# Patient Record
Sex: Male | Born: 1935
Health system: Southern US, Community
[De-identification: ages and names within clinical notes are randomized; demographics above are authoritative.]

## PROBLEM LIST (undated history)

## (undated) DIAGNOSIS — M810 Age-related osteoporosis without current pathological fracture: Secondary | ICD-10-CM

## (undated) DIAGNOSIS — I10 Essential (primary) hypertension: Secondary | ICD-10-CM

## (undated) DIAGNOSIS — M4850XA Collapsed vertebra, not elsewhere classified, site unspecified, initial encounter for fracture: Secondary | ICD-10-CM

## (undated) DIAGNOSIS — K219 Gastro-esophageal reflux disease without esophagitis: Secondary | ICD-10-CM

## (undated) DIAGNOSIS — J302 Other seasonal allergic rhinitis: Secondary | ICD-10-CM

## (undated) DIAGNOSIS — J309 Allergic rhinitis, unspecified: Secondary | ICD-10-CM

## (undated) DIAGNOSIS — R972 Elevated prostate specific antigen [PSA]: Secondary | ICD-10-CM

## (undated) DIAGNOSIS — M1990 Unspecified osteoarthritis, unspecified site: Secondary | ICD-10-CM

## (undated) DIAGNOSIS — N400 Enlarged prostate without lower urinary tract symptoms: Secondary | ICD-10-CM

## (undated) DIAGNOSIS — D333 Benign neoplasm of cranial nerves: Secondary | ICD-10-CM

## (undated) DIAGNOSIS — M5410 Radiculopathy, site unspecified: Secondary | ICD-10-CM

## (undated) DIAGNOSIS — F329 Major depressive disorder, single episode, unspecified: Secondary | ICD-10-CM

## (undated) DIAGNOSIS — E559 Vitamin D deficiency, unspecified: Secondary | ICD-10-CM

## (undated) DIAGNOSIS — G4700 Insomnia, unspecified: Secondary | ICD-10-CM

## (undated) DIAGNOSIS — Z86010 Personal history of colonic polyps: Secondary | ICD-10-CM

## (undated) DIAGNOSIS — E538 Deficiency of other specified B group vitamins: Secondary | ICD-10-CM

## (undated) HISTORY — DX: Age-related osteoporosis without current pathological fracture: M81.0

## (undated) HISTORY — PX: CATARACT EXTRACTION W/ INTRAOCULAR LENS  IMPLANT, BILATERAL: SHX1307

## (undated) HISTORY — DX: Benign prostatic hyperplasia without lower urinary tract symptoms: N40.0

## (undated) HISTORY — DX: Major depressive disorder, single episode, unspecified: F32.9

## (undated) HISTORY — DX: Personal history of colonic polyps: Z86.010

## (undated) HISTORY — DX: Essential (primary) hypertension: I10

## (undated) HISTORY — PX: COLONOSCOPY: SHX174

## (undated) HISTORY — DX: Elevated prostate specific antigen (PSA): R97.20

## (undated) HISTORY — DX: Insomnia, unspecified: G47.00

## (undated) HISTORY — DX: Unspecified osteoarthritis, unspecified site: M19.90

## (undated) HISTORY — DX: Allergic rhinitis, unspecified: J30.9

## (undated) HISTORY — PX: OTHER SURGICAL HISTORY: SHX169

## (undated) HISTORY — DX: Vitamin D deficiency, unspecified: E55.9

## (undated) HISTORY — DX: Deficiency of other specified B group vitamins: E53.8

## (undated) HISTORY — DX: Other seasonal allergic rhinitis: J30.2

## (undated) HISTORY — DX: Gastro-esophageal reflux disease without esophagitis: K21.9

## (undated) HISTORY — DX: Radiculopathy, site unspecified: M54.10

## (undated) HISTORY — DX: Collapsed vertebra, not elsewhere classified, site unspecified, initial encounter for fracture: M48.50XA

---

## 1998-09-20 ENCOUNTER — Encounter: Payer: Self-pay | Admitting: Anesthesiology

## 1998-09-21 ENCOUNTER — Ambulatory Visit (HOSPITAL_COMMUNITY): Admission: RE | Admit: 1998-09-21 | Discharge: 1998-09-21 | Payer: Self-pay | Admitting: Specialist

## 1999-03-20 ENCOUNTER — Ambulatory Visit (HOSPITAL_COMMUNITY): Admission: RE | Admit: 1999-03-20 | Discharge: 1999-03-20 | Payer: Self-pay | Admitting: Specialist

## 2001-03-31 ENCOUNTER — Encounter (INDEPENDENT_AMBULATORY_CARE_PROVIDER_SITE_OTHER): Payer: Self-pay

## 2001-03-31 ENCOUNTER — Other Ambulatory Visit: Admission: RE | Admit: 2001-03-31 | Discharge: 2001-03-31 | Payer: Self-pay | Admitting: Internal Medicine

## 2001-03-31 LAB — HM COLONOSCOPY: HM Colonoscopy: ABNORMAL

## 2005-07-06 ENCOUNTER — Emergency Department (HOSPITAL_COMMUNITY): Admission: EM | Admit: 2005-07-06 | Discharge: 2005-07-06 | Payer: Self-pay | Admitting: Emergency Medicine

## 2005-07-11 ENCOUNTER — Encounter: Admission: RE | Admit: 2005-07-11 | Discharge: 2005-07-11 | Payer: Self-pay | Admitting: Endocrinology

## 2009-03-18 ENCOUNTER — Ambulatory Visit (HOSPITAL_COMMUNITY): Admission: RE | Admit: 2009-03-18 | Discharge: 2009-03-18 | Payer: Self-pay | Admitting: Ophthalmology

## 2009-04-01 ENCOUNTER — Ambulatory Visit (HOSPITAL_COMMUNITY): Admission: RE | Admit: 2009-04-01 | Discharge: 2009-04-01 | Payer: Self-pay | Admitting: Ophthalmology

## 2009-06-23 HISTORY — PX: OTHER SURGICAL HISTORY: SHX169

## 2010-01-05 ENCOUNTER — Ambulatory Visit: Payer: Self-pay | Admitting: Internal Medicine

## 2010-01-05 DIAGNOSIS — Z860100 Personal history of colon polyps, unspecified: Secondary | ICD-10-CM | POA: Insufficient documentation

## 2010-01-05 DIAGNOSIS — J309 Allergic rhinitis, unspecified: Secondary | ICD-10-CM

## 2010-01-05 DIAGNOSIS — N400 Enlarged prostate without lower urinary tract symptoms: Secondary | ICD-10-CM | POA: Insufficient documentation

## 2010-01-05 DIAGNOSIS — R972 Elevated prostate specific antigen [PSA]: Secondary | ICD-10-CM | POA: Insufficient documentation

## 2010-01-05 DIAGNOSIS — I10 Essential (primary) hypertension: Secondary | ICD-10-CM

## 2010-01-05 HISTORY — DX: Benign prostatic hyperplasia without lower urinary tract symptoms: N40.0

## 2010-01-05 HISTORY — DX: Personal history of colonic polyps: Z86.010

## 2010-01-05 HISTORY — DX: Elevated prostate specific antigen (PSA): R97.20

## 2010-01-05 HISTORY — DX: Allergic rhinitis, unspecified: J30.9

## 2010-01-05 HISTORY — DX: Personal history of colon polyps, unspecified: Z86.0100

## 2010-01-05 HISTORY — DX: Essential (primary) hypertension: I10

## 2010-01-05 LAB — CONVERTED CEMR LAB
Albumin: 4 g/dL (ref 3.5–5.2)
BUN: 14 mg/dL (ref 6–23)
Basophils Absolute: 0 10*3/uL (ref 0.0–0.1)
Bilirubin Urine: NEGATIVE
CO2: 27 meq/L (ref 19–32)
Chloride: 100 meq/L (ref 96–112)
Cholesterol: 168 mg/dL (ref 0–200)
Glucose, Bld: 89 mg/dL (ref 70–99)
HCT: 39.8 % (ref 39.0–52.0)
Ketones, ur: NEGATIVE mg/dL
LDL Cholesterol: 77 mg/dL (ref 0–99)
Leukocytes, UA: NEGATIVE
Lymphs Abs: 1.5 10*3/uL (ref 0.7–4.0)
MCHC: 33.3 g/dL (ref 30.0–36.0)
MCV: 98.9 fL (ref 78.0–100.0)
Monocytes Absolute: 0.4 10*3/uL (ref 0.1–1.0)
Neutro Abs: 3.9 10*3/uL (ref 1.4–7.7)
Nitrite: NEGATIVE
Platelets: 214 10*3/uL (ref 150.0–400.0)
Potassium: 4 meq/L (ref 3.5–5.1)
RDW: 13.2 % (ref 11.5–14.6)
Specific Gravity, Urine: 1.01 (ref 1.000–1.030)
Total Bilirubin: 0.9 mg/dL (ref 0.3–1.2)
Total Protein, Urine: NEGATIVE mg/dL
Triglycerides: 111 mg/dL (ref 0.0–149.0)
VLDL: 22.2 mg/dL (ref 0.0–40.0)
pH: 7 (ref 5.0–8.0)

## 2010-03-02 ENCOUNTER — Telehealth: Payer: Self-pay | Admitting: Internal Medicine

## 2010-06-26 ENCOUNTER — Encounter: Payer: Self-pay | Admitting: Internal Medicine

## 2010-10-18 ENCOUNTER — Ambulatory Visit: Payer: Self-pay | Admitting: Internal Medicine

## 2011-01-14 ENCOUNTER — Encounter: Payer: Self-pay | Admitting: Ophthalmology

## 2011-01-23 NOTE — Progress Notes (Signed)
Summary: RX request  Phone Note Call from Patient Call back at Home Phone [phone]   Caller: Patient Walk In Summary of Call: pt came into Clinic requesting refill of Bisoprolol, Amlodipine, Lisinopril and Tamsulosin to [facility] pharmacy. Initial call taken by: Margaret Pyle, CMA,  March 02, 2010 4:12 PM    Prescriptions: TAMSULOSIN HCL 0.4 MG CAPS (TAMSULOSIN HCL) 1 by mouth once daily  #90 x 3   Entered by:   Margaret Pyle, CMA   Authorized by:   Corwin Levins MD   Signed by:   Margaret Pyle, CMA on 03/02/2010   Method used:   Electronically to        [facility]* (mail-order)             ,          Ph: [phone]       Fax: [phone]   RxID:   [id number] LISINOPRIL 20 MG TABS (LISINOPRIL) 1 by mouth once daily  #90 x 3   Entered by:   Margaret Pyle, CMA   Authorized by:   Corwin Levins MD   Signed by:   Margaret Pyle, CMA on 03/02/2010   Method used:   Electronically to        [facility]* (mail-order)             ,          Ph: [phone]       Fax: [phone]   RxID:   [id number] AMLODIPINE BESYLATE 10 MG TABS (AMLODIPINE BESYLATE) 1 by mouth once daily  #90 x 3   Entered by:   Margaret Pyle, CMA   Authorized by:   Corwin Levins MD   Signed by:   Margaret Pyle, CMA on 03/02/2010   Method used:   Electronically to        [facility] [employer] [facility]* (mail-order)             ,          Ph: [phone]       Fax: [phone]   RxID:   [id number] BISOPROLOL-HYDROCHLOROTHIAZIDE 2.5-6.25 MG TABS (BISOPROLOL-HYDROCHLOROTHIAZIDE) 1 by mouth once daily  #90 x 3   Entered by:   Margaret Pyle, CMA   Authorized by:   Corwin Levins MD   Signed by:   Margaret Pyle, CMA on 03/02/2010   Method used:   Electronically to        [facility]* (mail-order)             ,          Ph: [phone]       Fax: [phone]   RxID:   [id number]

## 2011-01-23 NOTE — Letter (Signed)
Summary: Health Risk Assessment/[employer]  Health Risk Assessment/[employer]   Imported By: Sherian Rein 07/28/2010 08:24:46  _____________________________________________________________________  External Attachment:    Type:   Image     Comment:   External Document
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
## 2011-01-23 NOTE — Assessment & Plan Note (Signed)
Summary: FLU Zara Council /JWJ Natale Milch  Nurse Visit   Allergies: No Known Drug Allergies  Orders Added: 1)  Flu Vaccine 48yrs + MEDICARE PATIENTS [Q2039] 2)  Administration Flu vaccine - MCR [G0008]    Flu Vaccine Consent Questions     Do you have a history of severe allergic reactions to this vaccine? no    Any prior history of allergic reactions to egg and/or gelatin? no    Do you have a sensitivity to the preservative Thimersol? no    Do you have a past history of Guillan-Barre Syndrome? no    Do you currently have an acute febrile illness? no    Have you ever had a severe reaction to latex? no    Vaccine information given and explained to patient? yes    Are you currently pregnant? no    Lot Number:[id number]   Exp Date:06/23/2011   Site Given  Left Deltoid IM

## 2011-03-19 ENCOUNTER — Other Ambulatory Visit: Payer: Self-pay

## 2011-03-19 MED ORDER — MELOXICAM 15 MG PO TABS: 15.0000 mg | ORAL_TABLET | Freq: Every day | ORAL | Status: DC

## 2011-03-19 MED ORDER — OMEPRAZOLE 20 MG PO CPDR: 20.0000 mg | DELAYED_RELEASE_CAPSULE | Freq: Two times a day (BID) | ORAL | Status: DC

## 2011-03-19 MED ORDER — AMLODIPINE BESYLATE 10 MG PO TABS: 10.0000 mg | ORAL_TABLET | Freq: Every day | ORAL | Status: DC

## 2011-03-19 MED ORDER — TAMSULOSIN HCL 0.4 MG PO CAPS: 0.4000 mg | ORAL_CAPSULE | Freq: Every day | ORAL | Status: DC

## 2011-03-19 MED ORDER — BISOPROLOL-HYDROCHLOROTHIAZIDE 2.5-6.25 MG PO TABS: 1.0000 | ORAL_TABLET | Freq: Every day | ORAL | Status: DC

## 2011-03-19 MED ORDER — LISINOPRIL 20 MG PO TABS: 20.0000 mg | ORAL_TABLET | Freq: Every day | ORAL | Status: DC

## 2011-03-22 ENCOUNTER — Emergency Department (HOSPITAL_BASED_OUTPATIENT_CLINIC_OR_DEPARTMENT_OTHER)
Admission: EM | Admit: 2011-03-22 | Discharge: 2011-03-22 | Disposition: A | Discharge status: Home / Self Care | Payer: Medicare Other | Attending: Emergency Medicine | Admitting: Emergency Medicine

## 2011-03-22 ENCOUNTER — Emergency Department (INDEPENDENT_AMBULATORY_CARE_PROVIDER_SITE_OTHER): Payer: Medicare Other

## 2011-03-22 DIAGNOSIS — W11XXXA Fall on and from ladder, initial encounter: Secondary | ICD-10-CM

## 2011-03-22 DIAGNOSIS — Y92009 Unspecified place in unspecified non-institutional (private) residence as the place of occurrence of the external cause: Secondary | ICD-10-CM | POA: Insufficient documentation

## 2011-03-22 DIAGNOSIS — M79609 Pain in unspecified limb: Secondary | ICD-10-CM

## 2011-03-22 DIAGNOSIS — S8253XA Displaced fracture of medial malleolus of unspecified tibia, initial encounter for closed fracture: Secondary | ICD-10-CM

## 2011-03-22 DIAGNOSIS — S51809A Unspecified open wound of unspecified forearm, initial encounter: Secondary | ICD-10-CM | POA: Insufficient documentation

## 2011-03-22 DIAGNOSIS — Z79899 Other long term (current) drug therapy: Secondary | ICD-10-CM | POA: Insufficient documentation

## 2011-04-04 LAB — CREATININE, SERUM
Creatinine, Ser: 0.94 mg/dL (ref 0.4–1.5)
GFR calc non Af Amer: >60 mL/min

## 2011-04-04 LAB — BUN: BUN: 15 mg/dL (ref 6–23)

## 2011-04-24 ENCOUNTER — Ambulatory Visit: Payer: Self-pay | Admitting: Internal Medicine

## 2011-05-28 ENCOUNTER — Other Ambulatory Visit: Payer: Self-pay | Admitting: Internal Medicine

## 2011-06-28 ENCOUNTER — Encounter: Payer: Self-pay | Admitting: Internal Medicine

## 2011-07-01 ENCOUNTER — Encounter: Payer: Self-pay | Admitting: Internal Medicine

## 2011-07-01 DIAGNOSIS — Z0000 Encounter for general adult medical examination without abnormal findings: Secondary | ICD-10-CM | POA: Insufficient documentation

## 2011-07-03 ENCOUNTER — Encounter: Payer: Self-pay | Admitting: Internal Medicine

## 2011-07-03 ENCOUNTER — Other Ambulatory Visit (INDEPENDENT_AMBULATORY_CARE_PROVIDER_SITE_OTHER): Payer: Medicare Other

## 2011-07-03 ENCOUNTER — Ambulatory Visit (INDEPENDENT_AMBULATORY_CARE_PROVIDER_SITE_OTHER): Payer: Medicare Other | Admitting: Internal Medicine

## 2011-07-03 VITALS — BP 122/58 | HR 56 | Temp 97.9°F | Ht 70.0 in | Wt 196.0 lb

## 2011-07-03 DIAGNOSIS — Z0000 Encounter for general adult medical examination without abnormal findings: Secondary | ICD-10-CM

## 2011-07-03 DIAGNOSIS — Z125 Encounter for screening for malignant neoplasm of prostate: Secondary | ICD-10-CM

## 2011-07-03 DIAGNOSIS — G4700 Insomnia, unspecified: Secondary | ICD-10-CM

## 2011-07-03 DIAGNOSIS — I10 Essential (primary) hypertension: Secondary | ICD-10-CM

## 2011-07-03 HISTORY — DX: Insomnia, unspecified: G47.00

## 2011-07-03 LAB — CBC WITH DIFFERENTIAL/PLATELET
Eosinophils Absolute: 0 10*3/uL (ref 0.0–0.7)
Eosinophils Relative: 0.7 % (ref 0.0–5.0)
HCT: 39.5 % (ref 39.0–52.0)
Lymphocytes Relative: 25.5 % (ref 12.0–46.0)
Monocytes Absolute: 0.4 10*3/uL (ref 0.1–1.0)
Monocytes Relative: 6.8 % (ref 3.0–12.0)
Neutrophils Relative %: 66.5 % (ref 43.0–77.0)
Platelets: 250 10*3/uL (ref 150.0–400.0)
RDW: 13.9 % (ref 11.5–14.6)
WBC: 6 10*3/uL (ref 4.5–10.5)

## 2011-07-03 LAB — BASIC METABOLIC PANEL
BUN: 16 mg/dL (ref 6–23)
CO2: 29 mEq/L (ref 19–32)
GFR: 108.88 mL/min (ref >60.00)
Glucose, Bld: 89 mg/dL (ref 70–99)
Potassium: 5.1 mEq/L (ref 3.5–5.1)
Sodium: 141 mEq/L (ref 135–145)

## 2011-07-03 LAB — HEPATIC FUNCTION PANEL
ALT: 14 U/L (ref 0–53)
AST: 16 U/L (ref 0–37)
Albumin: 4.3 g/dL (ref 3.5–5.2)
Alkaline Phosphatase: 67 U/L (ref 39–117)
Total Protein: 6.7 g/dL (ref 6.0–8.3)

## 2011-07-03 LAB — URINALYSIS, ROUTINE W REFLEX MICROSCOPIC
Ketones, ur: NEGATIVE
Leukocytes, UA: NEGATIVE
Nitrite: NEGATIVE
Specific Gravity, Urine: 1.015 (ref 1.000–1.030)
pH: 7.5 (ref 5.0–8.0)

## 2011-07-03 LAB — LIPID PANEL
Cholesterol: 153 mg/dL (ref 0–200)
HDL: 84.1 mg/dL (ref >39.00)
VLDL: 3.8 mg/dL (ref 0.0–40.0)

## 2011-07-03 LAB — PSA: PSA: 3.61 ng/mL (ref 0.10–4.00)

## 2011-07-03 MED ORDER — MELOXICAM 15 MG PO TABS: 15.0000 mg | ORAL_TABLET | Freq: Every day | ORAL | Status: AC

## 2011-07-03 MED ORDER — AMLODIPINE BESYLATE 10 MG PO TABS: 10.0000 mg | ORAL_TABLET | Freq: Every day | ORAL | Status: DC

## 2011-07-03 MED ORDER — ZOLPIDEM TARTRATE ER 6.25 MG PO TBCR: 6.2500 mg | EXTENDED_RELEASE_TABLET | Freq: Every evening | ORAL | Status: DC | PRN

## 2011-07-03 MED ORDER — BISOPROLOL-HYDROCHLOROTHIAZIDE 2.5-6.25 MG PO TABS: 1.0000 | ORAL_TABLET | Freq: Every day | ORAL | Status: DC

## 2011-07-03 MED ORDER — LISINOPRIL 20 MG PO TABS: 20.0000 mg | ORAL_TABLET | Freq: Every day | ORAL | Status: DC

## 2011-07-03 MED ORDER — TAMSULOSIN HCL 0.4 MG PO CAPS: 0.4000 mg | ORAL_CAPSULE | Freq: Every day | ORAL | Status: DC

## 2011-07-03 MED ORDER — OMEPRAZOLE 20 MG PO CPDR: 20.0000 mg | DELAYED_RELEASE_CAPSULE | Freq: Two times a day (BID) | ORAL | Status: DC

## 2011-07-03 NOTE — Assessment & Plan Note (Signed)
Overall doing well, age appropriate education and counseling updated, referrals for preventative services and immunizations addressed, dietary and smoking counseling addressed, most recent labs and ECG reviewed.  I have personally reviewed and have noted: 1) the patient's medical and social history 2) The pt's use of alcohol, tobacco, and illicit drugs 3) The patient's current medications and supplements 4) Functional ability including ADL's, fall risk, home safety risk, hearing and visual impairment 5) Diet and physical activities 6) Evidence for depression or mood disorder 7) The patient's height, weight, and BMI have been recorded in the chart I have made referrals, and provided counseling and education based on review of the above Also for colonoscopy as he is due
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
## 2011-07-03 NOTE — Assessment & Plan Note (Signed)
Ok to try [employer] cr prn . . to f/u any worsening symptoms or concerns

## 2011-07-03 NOTE — Patient Instructions (Addendum)
Take all new medications as prescribed - the sleep medication (which can be increased if not working well) You will be contacted regarding the referral for: colonoscopy Please go to LAB in the Basement for the blood and/or urine tests to be done today Please call the phone number [phone] (the PhoneTree System) for results of testing in 2-3 days;  When calling, simply dial the number, and when prompted enter the MRN number above (the Medical Record Number) and the # key, then the message should start. The current medical regimen is effective;  continue present plan and medications. - all meds were sent to [facility] Please return in 1 year for your yearly visit, or sooner if needed, with Lab testing done 3-5 days before

## 2011-07-03 NOTE — Progress Notes (Signed)
Quick Note:  Voice message left on PhoneTree system - lab is negative, normal or otherwise stable, pt to continue same tx ______ 

## 2011-07-11 ENCOUNTER — Telehealth: Payer: Self-pay | Admitting: *Deleted

## 2011-07-11 MED ORDER — ZOLPIDEM TARTRATE ER 12.5 MG PO TBCR: 12.5000 mg | EXTENDED_RELEASE_TABLET | Freq: Every evening | ORAL | Status: DC | PRN

## 2011-07-11 NOTE — Telephone Encounter (Signed)
Patient c/o sleep medication not working. Requesting higher dosage or alternative medication.

## 2011-07-11 NOTE — Telephone Encounter (Signed)
Ok to take 2 of his ambien Cr 6.25, and new rx Done hardcopy to sharon for the ambien cr 12.5

## 2011-07-12 ENCOUNTER — Encounter: Payer: Self-pay | Admitting: Internal Medicine

## 2011-07-12 NOTE — Telephone Encounter (Signed)
Pt informed Rx ready for p/u.

## 2011-08-22 ENCOUNTER — Ambulatory Visit (AMBULATORY_SURGERY_CENTER): Payer: Medicare Other | Admitting: *Deleted

## 2011-08-22 ENCOUNTER — Encounter: Payer: Self-pay | Admitting: Internal Medicine

## 2011-08-22 VITALS — Ht 70.0 in | Wt 194.0 lb

## 2011-08-22 DIAGNOSIS — Z1211 Encounter for screening for malignant neoplasm of colon: Secondary | ICD-10-CM

## 2011-08-22 MED ORDER — SUPREP BOWEL PREP KIT 17.5-3.13-1.6 GM/177ML PO SOLN: 1.0000 | ORAL | Status: DC

## 2011-09-05 ENCOUNTER — Encounter: Payer: Self-pay | Admitting: Internal Medicine

## 2011-09-05 ENCOUNTER — Ambulatory Visit (AMBULATORY_SURGERY_CENTER): Payer: Medicare Other | Admitting: Internal Medicine

## 2011-09-05 VITALS — BP 145/60 | HR 51 | Temp 98.3°F | Resp 16 | Ht 70.0 in | Wt 194.0 lb

## 2011-09-05 DIAGNOSIS — K5730 Diverticulosis of large intestine without perforation or abscess without bleeding: Secondary | ICD-10-CM

## 2011-09-05 DIAGNOSIS — D126 Benign neoplasm of colon, unspecified: Secondary | ICD-10-CM

## 2011-09-05 DIAGNOSIS — D129 Benign neoplasm of anus and anal canal: Secondary | ICD-10-CM

## 2011-09-05 DIAGNOSIS — Z1211 Encounter for screening for malignant neoplasm of colon: Secondary | ICD-10-CM

## 2011-09-05 DIAGNOSIS — D128 Benign neoplasm of rectum: Secondary | ICD-10-CM

## 2011-09-05 MED ORDER — SODIUM CHLORIDE 0.9 % IV SOLN: 500.0000 mL | INTRAVENOUS | Status: DC

## 2011-09-05 NOTE — Patient Instructions (Signed)
Please refer to your blue and neon green sheets for instructions regarding diet and activity for the rest of today.  You may resume your medications as you would normally take them.  You will receive a letter in the mail in about two weeks regarding the results of the biopsies taken today.  Polyps, Colon  A polyp is extra tissue that grows inside your body. Colon polyps grow in the large intestine. The large intestine, also called the colon, is part of your digestive system. It is a long, hollow tube at the end of your digestive tract where your body makes and stores stool. Most polyps are not dangerous. They are benign. This means they are not cancerous. But over time, some types of polyps can turn into cancer. Polyps that are smaller than a pea are usually not harmful. But larger polyps could someday become or may already be cancerous. To be safe, doctors remove all polyps and test them.  WHO GETS POLYPS? Anyone can get polyps, but certain people are more likely than others. You may have a greater chance of getting polyps if:  You are over 50.   You have had polyps before.   Someone in your family has had polyps.   Someone in your family has had cancer of the large intestine.   Find out if someone in your family has had polyps. You may also be more likely to get polyps if you:   Eat a lot of fatty foods   Smoke   Drink alcohol   Do not exercise  Eat too much  SYMPTOMS Most small polyps do not cause symptoms. People often do not know they have one until their caregiver finds it during a regular checkup or while testing them for something else. Some people do have symptoms like these:  Bleeding from the anus. You might notice blood on your underwear or on toilet paper after you have had a bowel movement.   Constipation or diarrhea that lasts more than a week.   Blood in the stool. Blood can make stool look black or it can show up as red streaks in the stool.  If you have any of  these symptoms, see your caregiver. HOW DOES THE DOCTOR TEST FOR POLYPS? The doctor can use four tests to check for polyps:  Digital rectal exam. The caregiver wears gloves and checks your rectum (the last part of the large intestine) to see if it feels normal. This test would find polyps only in the rectum. Your caregiver may need to do one of the other tests listed below to find polyps higher up in the intestine.   Barium enema. The caregiver puts a liquid called barium into your rectum before taking x-rays of your large intestine. Barium makes your intestine look white in the pictures. Polyps are dark, so they are easy to see.   Sigmoidoscopy. With this test, the caregiver can see inside your large intestine. A thin flexible tube is placed into your rectum. The device is called a sigmoidoscope, which has a light and a tiny video camera in it. The caregiver uses the sigmoidoscope to look at the last third of your large intestine.   Colonoscopy. This test is like sigmoidoscopy, but the caregiver looks at all of the large intestine. It usually requires sedation. This is the most common method for finding and removing polyps.  TREATMENT  The caregiver will remove the polyp during sigmoidoscopy or colonoscopy. The polyp is then tested for cancer.  If you have had polyps, your caregiver may want you to get tested regularly in the future.  PREVENTION There is not one sure way to prevent polyps. You might be able to lower your risk of getting them if you:  Eat more fruits and vegetables and less fatty food.   Do not smoke.   Avoid alcohol.   Exercise every day.   Lose weight if you are overweight.   Eating more calcium and folate can also lower your risk of getting polyps. Some foods that are rich in calcium are milk, cheese, and broccoli. Some foods that are rich in folate are chickpeas, kidney beans, and spinach.   Aspirin might help prevent polyps. Studies are under way.  Document  Released: 09/05/2004 Document Re-Released: 05/30/2010 [facility] Patient Information 2011 [place], [place].  Diverticulosis Diverticulosis is a common condition that develops when small pouches (diverticula) form in the wall of the colon. The risk of diverticulosis increases with age. It happens more often in people who eat a low-fiber diet. Most individuals with diverticulosis have no symptoms. Those individuals with symptoms usually experience belly (abdominal) pain, constipation, or loose stools (diarrhea). HOME CARE INSTRUCTIONS  Increase the amount of fiber in your diet as directed by your caregiver or dietician. This may reduce symptoms of diverticulosis.   Your caregiver may recommend taking a dietary fiber supplement.   Drink at least 6 to 8 glasses of water each day to prevent constipation.   Try not to strain when you have a bowel movement.   Your caregiver may recommend avoiding nuts and seeds to prevent complications, although this is still an uncertain benefit.   Only take over-the-counter or prescription medicines for pain, discomfort, or fever as directed by your caregiver.  FOODS HAVING HIGH FIBER CONTENT INCLUDE:  Fruits. Apple, peach, pear, tangerine, raisins, prunes.   Vegetables. Brussels sprouts, asparagus, broccoli, cabbage, carrot, cauliflower, romaine lettuce, spinach, summer squash, tomato, winter squash, zucchini.   Starchy Vegetables. Baked beans, kidney beans, lima beans, split peas, lentils, potatoes (with skin).   Grains. Whole wheat bread, brown rice, bran flake cereal, plain oatmeal, white rice, shredded wheat, bran muffins.  SEEK IMMEDIATE MEDICAL CARE IF:  You develop increasing pain or severe bloating.   You have an increased oral temperature, not controlled by medicine.   You develop vomiting or bowel movements that are bloody or black.  Document Released: 09/06/2004 Document Re-Released: 05/30/2010 [facility] Patient Information 2011 [place],  [place].

## 2011-09-06 ENCOUNTER — Telehealth: Payer: Self-pay | Admitting: *Deleted

## 2011-09-06 NOTE — Telephone Encounter (Signed)

## 2011-10-30 ENCOUNTER — Ambulatory Visit (INDEPENDENT_AMBULATORY_CARE_PROVIDER_SITE_OTHER): Payer: Medicare Other

## 2011-10-30 DIAGNOSIS — Z23 Encounter for immunization: Secondary | ICD-10-CM

## 2012-02-19 ENCOUNTER — Ambulatory Visit (INDEPENDENT_AMBULATORY_CARE_PROVIDER_SITE_OTHER): Payer: Medicare Other | Admitting: Internal Medicine

## 2012-02-19 ENCOUNTER — Encounter: Payer: Self-pay | Admitting: Internal Medicine

## 2012-02-19 VITALS — BP 142/60 | HR 67 | Temp 97.3°F | Ht 69.0 in | Wt 191.5 lb

## 2012-02-19 DIAGNOSIS — R609 Edema, unspecified: Secondary | ICD-10-CM

## 2012-02-19 DIAGNOSIS — R21 Rash and other nonspecific skin eruption: Secondary | ICD-10-CM

## 2012-02-19 DIAGNOSIS — M79671 Pain in right foot: Secondary | ICD-10-CM | POA: Insufficient documentation

## 2012-02-19 DIAGNOSIS — I10 Essential (primary) hypertension: Secondary | ICD-10-CM

## 2012-02-19 DIAGNOSIS — M79672 Pain in left foot: Secondary | ICD-10-CM

## 2012-02-19 DIAGNOSIS — M79609 Pain in unspecified limb: Secondary | ICD-10-CM

## 2012-02-19 MED ORDER — ZOLPIDEM TARTRATE ER 12.5 MG PO TBCR: 12.5000 mg | EXTENDED_RELEASE_TABLET | Freq: Every evening | ORAL | Status: DC | PRN

## 2012-02-19 MED ORDER — AMITRIPTYLINE HCL 100 MG PO TABS: 100.0000 mg | ORAL_TABLET | Freq: Every day | ORAL | Status: DC

## 2012-02-19 NOTE — Patient Instructions (Addendum)
Take all new medications as prescribed - the amitryptilene for pain The amitryptilene also helps with sleep, so you may be able to stop the ambien if the sleep is ok with the amitryptilene Continue all other medications as before

## 2012-02-24 ENCOUNTER — Encounter: Payer: Self-pay | Admitting: Internal Medicine

## 2012-02-24 DIAGNOSIS — R609 Edema, unspecified: Secondary | ICD-10-CM | POA: Insufficient documentation

## 2012-02-24 DIAGNOSIS — R21 Rash and other nonspecific skin eruption: Secondary | ICD-10-CM | POA: Insufficient documentation
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
## 2012-02-24 NOTE — Assessment & Plan Note (Signed)
stable overall by hx and exam, most recent data reviewed with pt, and pt to continue medical treatment as before  BP Readings from Last 3 Encounters:  02/19/12 142/60  09/05/11 145/60  07/03/11 122/58

## 2012-02-24 NOTE — Assessment & Plan Note (Signed)
?   Edema vs inflmaatory related; for triam cr prn,  to f/u any worsening symptoms or concerns

## 2012-02-24 NOTE — Assessment & Plan Note (Signed)
stable overall by hx and exam, most recent data reviewed with pt, and pt to continue medical treatment as before  Lab Results  Component Value Date   WBC 6.0 07/03/2011   HGB 13.4 07/03/2011   HCT 39.5 07/03/2011   PLT 250.0 07/03/2011   GLUCOSE 89 07/03/2011   CHOL 153 07/03/2011   TRIG 19.0 07/03/2011   HDL 84.10 07/03/2011   LDLCALC 65 07/03/2011   ALT 14 07/03/2011   AST 16 07/03/2011   NA 141 07/03/2011   K 5.1 07/03/2011   CL 107 07/03/2011   CREATININE 0.7 07/03/2011   BUN 16 07/03/2011   CO2 29 07/03/2011   TSH 1.34 07/03/2011   PSA 3.61 07/03/2011

## 2012-02-24 NOTE — Assessment & Plan Note (Addendum)
Suspect neuropathic pain - for elavil qhs prn,  to f/u any worsening symptoms or concerns, declines ncs at this time

## 2012-06-06 ENCOUNTER — Other Ambulatory Visit: Payer: Self-pay

## 2012-06-06 MED ORDER — ZOLPIDEM TARTRATE ER 12.5 MG PO TBCR: 12.5000 mg | EXTENDED_RELEASE_TABLET | Freq: Every evening | ORAL | Status: DC | PRN

## 2012-06-06 NOTE — Telephone Encounter (Signed)
Faxed hardcopy to pharmacy. 

## 2012-06-06 NOTE — Telephone Encounter (Signed)
Done hardcopy to robin  

## 2012-06-30 ENCOUNTER — Other Ambulatory Visit (INDEPENDENT_AMBULATORY_CARE_PROVIDER_SITE_OTHER): Payer: Medicare Other

## 2012-06-30 DIAGNOSIS — Z79899 Other long term (current) drug therapy: Secondary | ICD-10-CM

## 2012-06-30 DIAGNOSIS — Z125 Encounter for screening for malignant neoplasm of prostate: Secondary | ICD-10-CM

## 2012-06-30 DIAGNOSIS — Z0000 Encounter for general adult medical examination without abnormal findings: Secondary | ICD-10-CM

## 2012-06-30 LAB — BASIC METABOLIC PANEL
BUN: 13 mg/dL (ref 6–23)
Calcium: 8.8 mg/dL (ref 8.4–10.5)
Creatinine, Ser: 0.8 mg/dL (ref 0.4–1.5)
GFR: 93.82 mL/min (ref >60.00)
Glucose, Bld: 100 mg/dL — ABNORMAL HIGH (ref 70–99)

## 2012-06-30 LAB — HEPATIC FUNCTION PANEL
ALT: 14 U/L (ref 0–53)
AST: 18 U/L (ref 0–37)
Bilirubin, Direct: 0.1 mg/dL (ref 0.0–0.3)
Total Bilirubin: 0.9 mg/dL (ref 0.3–1.2)
Total Protein: 6.7 g/dL (ref 6.0–8.3)

## 2012-06-30 LAB — CBC WITH DIFFERENTIAL/PLATELET
Basophils Relative: 0.5 % (ref 0.0–3.0)
Eosinophils Relative: 1 % (ref 0.0–5.0)
HCT: 39.6 % (ref 39.0–52.0)
MCV: 97.7 fl (ref 78.0–100.0)
Monocytes Absolute: 0.4 10*3/uL (ref 0.1–1.0)
Monocytes Relative: 7.1 % (ref 3.0–12.0)
Neutrophils Relative %: 58.3 % (ref 43.0–77.0)
Platelets: 226 10*3/uL (ref 150.0–400.0)
RBC: 4.05 Mil/uL — ABNORMAL LOW (ref 4.22–5.81)
WBC: 5.2 10*3/uL (ref 4.5–10.5)

## 2012-06-30 LAB — URINALYSIS, ROUTINE W REFLEX MICROSCOPIC
Hgb urine dipstick: NEGATIVE
Ketones, ur: NEGATIVE
Total Protein, Urine: NEGATIVE
Urine Glucose: NEGATIVE
Urobilinogen, UA: 0.2 (ref 0.0–1.0)

## 2012-06-30 LAB — LIPID PANEL: Cholesterol: 163 mg/dL (ref 0–200)

## 2012-06-30 LAB — TSH: TSH: 1.99 u[IU]/mL (ref 0.35–5.50)

## 2012-07-03 ENCOUNTER — Ambulatory Visit (INDEPENDENT_AMBULATORY_CARE_PROVIDER_SITE_OTHER): Payer: Medicare Other | Admitting: Internal Medicine

## 2012-07-03 ENCOUNTER — Encounter: Payer: Self-pay | Admitting: Internal Medicine

## 2012-07-03 VITALS — BP 110/60 | HR 54 | Temp 97.1°F | Ht 70.0 in | Wt 191.1 lb

## 2012-07-03 DIAGNOSIS — Z0000 Encounter for general adult medical examination without abnormal findings: Secondary | ICD-10-CM

## 2012-07-03 NOTE — Patient Instructions (Addendum)
Continue all other medications as before You are otherwise up to date with prevention Please have the pharmacy call with any refills you may need. Please return in 1 year for your yearly visit, or sooner if needed, with Lab testing done 3-5 days before

## 2012-07-03 NOTE — Assessment & Plan Note (Signed)
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
## 2012-09-10 ENCOUNTER — Telehealth: Payer: Self-pay | Admitting: Internal Medicine

## 2012-09-10 MED ORDER — BISOPROLOL-HYDROCHLOROTHIAZIDE 2.5-6.25 MG PO TABS: 1.0000 | ORAL_TABLET | Freq: Every day | ORAL | Status: DC

## 2012-09-10 MED ORDER — LISINOPRIL 20 MG PO TABS: 20.0000 mg | ORAL_TABLET | Freq: Every day | ORAL | Status: DC

## 2012-09-10 MED ORDER — TAMSULOSIN HCL 0.4 MG PO CAPS: 0.4000 mg | ORAL_CAPSULE | Freq: Every day | ORAL | Status: DC

## 2012-09-10 NOTE — Telephone Encounter (Signed)
Patient needs a refill on Bisoprolol, Lisinopril, and Tamsulosin sent to [facility]

## 2012-09-10 NOTE — Telephone Encounter (Signed)
Rx[s] sent to new mail order pharmacy [[facility]] as requested/SLS

## 2012-09-18 ENCOUNTER — Telehealth: Payer: Self-pay | Admitting: Internal Medicine

## 2012-09-18 MED ORDER — AMLODIPINE BESYLATE 10 MG PO TABS: 10.0000 mg | ORAL_TABLET | Freq: Every day | ORAL | Status: DC

## 2012-09-18 NOTE — Telephone Encounter (Signed)
Pt. Needs refill on Amlodipine sent to [facility]

## 2012-09-25 ENCOUNTER — Other Ambulatory Visit: Payer: Self-pay | Admitting: Internal Medicine

## 2012-09-25 NOTE — Telephone Encounter (Signed)
Ambien cr too soon, just had 6 mo supplly rx done June 2013

## 2012-09-29 ENCOUNTER — Other Ambulatory Visit: Payer: Self-pay | Admitting: Internal Medicine

## 2012-10-06 ENCOUNTER — Telehealth: Payer: Self-pay | Admitting: Internal Medicine

## 2012-10-06 MED ORDER — ZOLPIDEM TARTRATE ER 12.5 MG PO TBCR: 12.5000 mg | EXTENDED_RELEASE_TABLET | Freq: Every evening | ORAL | Status: DC | PRN

## 2012-10-06 NOTE — Telephone Encounter (Signed)
Called the patient informed hardcopy has been faxed to [facility]

## 2012-10-06 NOTE — Telephone Encounter (Signed)
The patient called hoping to get a refill of Ambien 12.5mg 

## 2012-10-06 NOTE — Telephone Encounter (Signed)
Done hardcopy to robin  

## 2012-10-21 ENCOUNTER — Ambulatory Visit (INDEPENDENT_AMBULATORY_CARE_PROVIDER_SITE_OTHER): Payer: Medicare Other

## 2012-10-21 DIAGNOSIS — Z23 Encounter for immunization: Secondary | ICD-10-CM

## 2012-12-22 ENCOUNTER — Other Ambulatory Visit: Payer: Self-pay

## 2012-12-22 MED ORDER — BISOPROLOL-HYDROCHLOROTHIAZIDE 2.5-6.25 MG PO TABS: 1.0000 | ORAL_TABLET | Freq: Every day | ORAL | Status: DC

## 2012-12-22 MED ORDER — LISINOPRIL 20 MG PO TABS: 20.0000 mg | ORAL_TABLET | Freq: Every day | ORAL | Status: DC

## 2012-12-22 MED ORDER — TAMSULOSIN HCL 0.4 MG PO CAPS: 0.4000 mg | ORAL_CAPSULE | Freq: Every day | ORAL | Status: DC

## 2012-12-22 MED ORDER — AMLODIPINE BESYLATE 10 MG PO TABS: 10.0000 mg | ORAL_TABLET | Freq: Every day | ORAL | Status: DC

## 2013-06-30 ENCOUNTER — Other Ambulatory Visit (INDEPENDENT_AMBULATORY_CARE_PROVIDER_SITE_OTHER): Payer: Medicare Other

## 2013-06-30 DIAGNOSIS — I10 Essential (primary) hypertension: Secondary | ICD-10-CM

## 2013-06-30 DIAGNOSIS — Z0000 Encounter for general adult medical examination without abnormal findings: Secondary | ICD-10-CM

## 2013-06-30 LAB — URINALYSIS, ROUTINE W REFLEX MICROSCOPIC
Bilirubin Urine: NEGATIVE
Hgb urine dipstick: NEGATIVE
Ketones, ur: NEGATIVE
Leukocytes, UA: NEGATIVE
RBC / HPF: NONE SEEN (ref 0–?)
Specific Gravity, Urine: 1.015 (ref 1.000–1.030)
Urine Glucose: NEGATIVE
Urobilinogen, UA: 0.2 (ref 0.0–1.0)

## 2013-06-30 LAB — CBC WITH DIFFERENTIAL/PLATELET
Basophils Absolute: 0 10*3/uL (ref 0.0–0.1)
Eosinophils Absolute: 0.1 10*3/uL (ref 0.0–0.7)
HCT: 38.2 % — ABNORMAL LOW (ref 39.0–52.0)
Hemoglobin: 13.1 g/dL (ref 13.0–17.0)
Lymphs Abs: 1.4 10*3/uL (ref 0.7–4.0)
MCHC: 34.4 g/dL (ref 30.0–36.0)
Monocytes Relative: 6 % (ref 3.0–12.0)
Neutro Abs: 3.9 10*3/uL (ref 1.4–7.7)
RDW: 13.4 % (ref 11.5–14.6)

## 2013-06-30 LAB — HEPATIC FUNCTION PANEL
AST: 16 U/L (ref 0–37)
Albumin: 3.9 g/dL (ref 3.5–5.2)
Alkaline Phosphatase: 53 U/L (ref 39–117)
Total Protein: 6.3 g/dL (ref 6.0–8.3)

## 2013-06-30 LAB — PSA: PSA: 3.64 ng/mL (ref 0.10–4.00)

## 2013-06-30 LAB — BASIC METABOLIC PANEL
Calcium: 8.8 mg/dL (ref 8.4–10.5)
Creatinine, Ser: 0.9 mg/dL (ref 0.4–1.5)
GFR: 91.07 mL/min (ref >60.00)

## 2013-06-30 LAB — LIPID PANEL
Cholesterol: 149 mg/dL (ref 0–200)
Triglycerides: 32 mg/dL (ref 0.0–149.0)

## 2013-07-06 ENCOUNTER — Ambulatory Visit (INDEPENDENT_AMBULATORY_CARE_PROVIDER_SITE_OTHER): Payer: Medicare Other | Admitting: Internal Medicine

## 2013-07-06 ENCOUNTER — Encounter: Payer: Self-pay | Admitting: Internal Medicine

## 2013-07-06 VITALS — BP 112/68 | HR 57 | Temp 98.1°F | Ht 69.0 in | Wt 193.4 lb

## 2013-07-06 DIAGNOSIS — F32A Depression, unspecified: Secondary | ICD-10-CM | POA: Insufficient documentation

## 2013-07-06 DIAGNOSIS — F329 Major depressive disorder, single episode, unspecified: Secondary | ICD-10-CM

## 2013-07-06 DIAGNOSIS — Z136 Encounter for screening for cardiovascular disorders: Secondary | ICD-10-CM

## 2013-07-06 DIAGNOSIS — I10 Essential (primary) hypertension: Secondary | ICD-10-CM

## 2013-07-06 DIAGNOSIS — Z0000 Encounter for general adult medical examination without abnormal findings: Secondary | ICD-10-CM

## 2013-07-06 HISTORY — DX: Depression, unspecified: F32.A

## 2013-07-06 MED ORDER — SERTRALINE HCL 50 MG PO TABS: 50.0000 mg | ORAL_TABLET | Freq: Every day | ORAL | Status: DC

## 2013-07-06 MED ORDER — DICLOFENAC SODIUM 75 MG PO TBEC: 75.0000 mg | DELAYED_RELEASE_TABLET | Freq: Two times a day (BID) | ORAL | Status: DC | PRN

## 2013-07-06 NOTE — Patient Instructions (Signed)
OK to change the mobic to Diclofenac as needed for pain OK to stop the Amitryptilene as you have Please take all new medication as prescribed  - the generic Zoloft 50 mg per day Please continue all other medications as before, and refills have been done if requested. Please continue your efforts at being more active, low cholesterol diet, and weight control. You are otherwise up to date with prevention measures today. Please keep your appointments with your specialists as you may have planned  Please remember to sign up for My Chart if you have not done so, as this will be important to you in the future with finding out test results, communicating by private email, and scheduling acute appointments online when needed.  Please return in 1 year for your yearly visit, or sooner if needed, with Lab testing done 3-5 days before

## 2013-07-06 NOTE — Assessment & Plan Note (Signed)
Mild, verified no SI, for zoloft 50 qd,  to f/u any worsening symptoms or concerns

## 2013-07-06 NOTE — Assessment & Plan Note (Signed)
stable overall by history and exam, recent data reviewed with pt, and pt to continue medical treatment as before,  to f/u any worsening symptoms or concerns BP Readings from Last 3 Encounters:  07/06/13 112/68  07/03/12 110/60  02/19/12 142/60

## 2013-07-06 NOTE — Assessment & Plan Note (Signed)
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
## 2013-11-05 ENCOUNTER — Ambulatory Visit (INDEPENDENT_AMBULATORY_CARE_PROVIDER_SITE_OTHER): Payer: Medicare Other

## 2013-11-05 DIAGNOSIS — Z23 Encounter for immunization: Secondary | ICD-10-CM

## 2014-01-06 ENCOUNTER — Encounter: Payer: Self-pay | Admitting: Internal Medicine

## 2014-01-06 ENCOUNTER — Ambulatory Visit (INDEPENDENT_AMBULATORY_CARE_PROVIDER_SITE_OTHER): Payer: Medicare Other | Admitting: Internal Medicine

## 2014-01-06 VITALS — BP 122/68 | HR 60 | Temp 97.8°F | Ht 70.0 in | Wt 192.2 lb

## 2014-01-06 DIAGNOSIS — M79609 Pain in unspecified limb: Secondary | ICD-10-CM

## 2014-01-06 DIAGNOSIS — M79671 Pain in right foot: Secondary | ICD-10-CM

## 2014-01-06 DIAGNOSIS — G4700 Insomnia, unspecified: Secondary | ICD-10-CM

## 2014-01-06 DIAGNOSIS — Z23 Encounter for immunization: Secondary | ICD-10-CM

## 2014-01-06 DIAGNOSIS — Z0000 Encounter for general adult medical examination without abnormal findings: Secondary | ICD-10-CM

## 2014-01-06 DIAGNOSIS — M1990 Unspecified osteoarthritis, unspecified site: Secondary | ICD-10-CM | POA: Insufficient documentation

## 2014-01-06 DIAGNOSIS — M79672 Pain in left foot: Principal | ICD-10-CM

## 2014-01-06 DIAGNOSIS — I10 Essential (primary) hypertension: Secondary | ICD-10-CM

## 2014-01-06 MED ORDER — BISOPROLOL-HYDROCHLOROTHIAZIDE 2.5-6.25 MG PO TABS: 1.0000 | ORAL_TABLET | Freq: Every day | ORAL | Status: DC

## 2014-01-06 MED ORDER — AMLODIPINE BESYLATE 10 MG PO TABS: 10.0000 mg | ORAL_TABLET | Freq: Every day | ORAL | Status: DC

## 2014-01-06 MED ORDER — AMITRIPTYLINE HCL 50 MG PO TABS: 50.0000 mg | ORAL_TABLET | Freq: Every day | ORAL | Status: DC

## 2014-01-06 MED ORDER — LISINOPRIL 20 MG PO TABS: 20.0000 mg | ORAL_TABLET | Freq: Every day | ORAL | Status: DC

## 2014-01-06 MED ORDER — TAMSULOSIN HCL 0.4 MG PO CAPS: 0.4000 mg | ORAL_CAPSULE | Freq: Every day | ORAL | Status: DC

## 2014-01-06 MED ORDER — OMEPRAZOLE 20 MG PO CPDR: 20.0000 mg | DELAYED_RELEASE_CAPSULE | Freq: Every day | ORAL | Status: DC
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
## 2014-01-06 NOTE — Assessment & Plan Note (Signed)
Likely some neuritic pain - for elavil qhs  - d/w pt s/e potential such as dizziness, dry mouth, constipation; start with lower dose,  to f/u any worsening symptoms or concerns

## 2014-01-06 NOTE — Patient Instructions (Addendum)
You had the new Prevnar pneumonia shot Please take all new medication as prescribed - the new amitriptylene for feet pain at night Please continue all other medications as before, and refills have been done if requested, including the generic prilosec 20 mg Please have the pharmacy call with any other refills you may need.  Please remember to sign up for My Chart if you have not done so, as this will be important to you in the future with finding out test results, communicating by private email, and scheduling acute appointments online when needed.  Please return in 6 months, or sooner if needed, with Lab testing done 3-5 days before

## 2014-01-06 NOTE — Assessment & Plan Note (Signed)
Cont nsaid prn, re-start PPI prophylaxis

## 2014-01-06 NOTE — Assessment & Plan Note (Signed)
Hopefully to improve with better feet pain management

## 2014-01-06 NOTE — Assessment & Plan Note (Signed)
stable overall by history and exam, recent data reviewed with pt, and pt to continue medical treatment as before,  to f/u any worsening symptoms or concerns BP Readings from Last 3 Encounters:  01/06/14 122/68  07/06/13 112/68  07/03/12 110/60

## 2014-01-06 NOTE — Addendum Note (Signed)
Addended by: Sharon Seller B on: 01/06/2014 02:29 PM   Modules accepted: Orders

## 2014-01-06 NOTE — Progress Notes (Signed)
Pre-visit discussion using our clinic review tool. No additional management support is needed unless otherwise documented below in the visit note.  

## 2014-01-21 ENCOUNTER — Telehealth: Payer: Self-pay

## 2014-01-21 MED ORDER — NORTRIPTYLINE HCL 50 MG PO CAPS: 50.0000 mg | ORAL_CAPSULE | Freq: Every day | ORAL | Status: DC

## 2014-01-21 NOTE — Telephone Encounter (Signed)
PA for Amitriptyline or alternative

## 2014-01-21 NOTE — Telephone Encounter (Signed)
Patient informed and ok with change

## 2014-01-21 NOTE — Telephone Encounter (Signed)
[place] for change to nortryptilene, hopefully ok with insurance - done erx, please let pt know

## 2014-06-23 ENCOUNTER — Other Ambulatory Visit: Payer: Self-pay

## 2014-06-23 MED ORDER — NORTRIPTYLINE HCL 50 MG PO CAPS: 50.0000 mg | ORAL_CAPSULE | Freq: Every day | ORAL | Status: DC

## 2014-06-23 MED ORDER — SERTRALINE HCL 50 MG PO TABS
50.0000 mg | ORAL_TABLET | Freq: Every day | ORAL | Status: DC
Start: 2014-06-23 — End: 2017-02-04

## 2014-06-23 NOTE — Telephone Encounter (Signed)
Done erx 

## 2014-07-07 ENCOUNTER — Ambulatory Visit: Payer: Medicare Other | Admitting: Internal Medicine

## 2014-07-09 ENCOUNTER — Other Ambulatory Visit (INDEPENDENT_AMBULATORY_CARE_PROVIDER_SITE_OTHER): Payer: Medicare Other

## 2014-07-09 DIAGNOSIS — I10 Essential (primary) hypertension: Secondary | ICD-10-CM

## 2014-07-09 DIAGNOSIS — Z0000 Encounter for general adult medical examination without abnormal findings: Secondary | ICD-10-CM

## 2014-07-09 LAB — CBC WITH DIFFERENTIAL/PLATELET
BASOS ABS: 0 10*3/uL (ref 0.0–0.1)
Basophils Relative: 0.4 % (ref 0.0–3.0)
Eosinophils Absolute: 0.1 10*3/uL (ref 0.0–0.7)
Eosinophils Relative: 1.1 % (ref 0.0–5.0)
HCT: 37.2 % — ABNORMAL LOW (ref 39.0–52.0)
Hemoglobin: 12.5 g/dL — ABNORMAL LOW (ref 13.0–17.0)
Lymphocytes Relative: 28.4 % (ref 12.0–46.0)
Lymphs Abs: 1.8 10*3/uL (ref 0.7–4.0)
MCHC: 33.6 g/dL (ref 30.0–36.0)
MCV: 95.9 fl (ref 78.0–100.0)
MONO ABS: 0.5 10*3/uL (ref 0.1–1.0)
MONOS PCT: 7.2 % (ref 3.0–12.0)
NEUTROS PCT: 62.9 % (ref 43.0–77.0)
Neutro Abs: 4 10*3/uL (ref 1.4–7.7)
PLATELETS: 261 10*3/uL (ref 150.0–400.0)
RBC: 3.88 Mil/uL — ABNORMAL LOW (ref 4.22–5.81)
RDW: 12.7 % (ref 11.5–15.5)
WBC: 6.3 10*3/uL (ref 4.0–10.5)

## 2014-07-09 LAB — BASIC METABOLIC PANEL
BUN: 20 mg/dL (ref 6–23)
CHLORIDE: 103 meq/L (ref 96–112)
CO2: 28 meq/L (ref 19–32)
Calcium: 8.9 mg/dL (ref 8.4–10.5)
Creatinine, Ser: 1.1 mg/dL (ref 0.4–1.5)
GFR: 67.66 mL/min (ref >60.00)
Glucose, Bld: 95 mg/dL (ref 70–99)
POTASSIUM: 4.4 meq/L (ref 3.5–5.1)
Sodium: 139 mEq/L (ref 135–145)

## 2014-07-09 LAB — HEPATIC FUNCTION PANEL
ALBUMIN: 3.6 g/dL (ref 3.5–5.2)
ALT: 14 U/L (ref 0–53)
AST: 15 U/L (ref 0–37)
Alkaline Phosphatase: 62 U/L (ref 39–117)
Bilirubin, Direct: 0.1 mg/dL (ref 0.0–0.3)
TOTAL PROTEIN: 6.3 g/dL (ref 6.0–8.3)
Total Bilirubin: 0.6 mg/dL (ref 0.2–1.2)

## 2014-07-09 LAB — URINALYSIS, ROUTINE W REFLEX MICROSCOPIC
BILIRUBIN URINE: NEGATIVE
Hgb urine dipstick: NEGATIVE
Ketones, ur: NEGATIVE
LEUKOCYTES UA: NEGATIVE
NITRITE: NEGATIVE
PH: 6.5 (ref 5.0–8.0)
Specific Gravity, Urine: 1.01 (ref 1.000–1.030)
Total Protein, Urine: NEGATIVE
UROBILINOGEN UA: 0.2 (ref 0.0–1.0)
Urine Glucose: NEGATIVE

## 2014-07-09 LAB — TSH: TSH: 1.79 u[IU]/mL (ref 0.35–4.50)

## 2014-07-09 LAB — LIPID PANEL
CHOLESTEROL: 148 mg/dL (ref 0–200)
HDL: 52.7 mg/dL (ref >39.00)
LDL CALC: 85 mg/dL (ref 0–99)
NonHDL: 95.3
Total CHOL/HDL Ratio: 3
Triglycerides: 52 mg/dL (ref 0.0–149.0)
VLDL: 10.4 mg/dL (ref 0.0–40.0)

## 2014-07-09 LAB — PSA: PSA: 4.55 ng/mL — ABNORMAL HIGH (ref 0.10–4.00)

## 2014-07-13 ENCOUNTER — Encounter: Payer: Self-pay | Admitting: Internal Medicine

## 2014-07-13 ENCOUNTER — Ambulatory Visit (INDEPENDENT_AMBULATORY_CARE_PROVIDER_SITE_OTHER): Payer: Medicare Other | Admitting: Internal Medicine

## 2014-07-13 VITALS — BP 108/60 | HR 62 | Temp 97.9°F | Ht 69.0 in | Wt 196.0 lb

## 2014-07-13 DIAGNOSIS — I10 Essential (primary) hypertension: Secondary | ICD-10-CM

## 2014-07-13 DIAGNOSIS — Z0000 Encounter for general adult medical examination without abnormal findings: Secondary | ICD-10-CM

## 2014-07-13 DIAGNOSIS — R972 Elevated prostate specific antigen [PSA]: Secondary | ICD-10-CM

## 2014-07-13 NOTE — Assessment & Plan Note (Signed)

## 2014-07-13 NOTE — Assessment & Plan Note (Signed)
Pt opts for psa repeat in 6 mo, over antibx trial or urology referral

## 2014-07-13 NOTE — Progress Notes (Signed)
Pre visit review using our clinic review tool, if applicable. No additional management support is needed unless otherwise documented below in the visit note. 

## 2014-07-13 NOTE — Patient Instructions (Signed)
Please continue all other medications as before, and refills have been done if requested.  Please have the pharmacy call with any other refills you may need.  Please continue your efforts at being more active, low cholesterol diet, and weight control.  You are otherwise up to date with prevention measures today.  Please keep your appointments with your specialists as you may have planned  Please return in 6 months, or sooner if needed, with Lab testing done 3-5 days before  
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
## 2014-07-13 NOTE — Assessment & Plan Note (Signed)
Borderline low, but asympt, also with recent cr to 1.1 with less GFR close to 60 now, unusual for him, will repeat bmet with labs next visit as well

## 2014-07-14 ENCOUNTER — Telehealth: Payer: Self-pay | Admitting: Internal Medicine

## 2014-07-14 NOTE — Telephone Encounter (Signed)
Relevant patient education assigned to patient using Emmi. ° °

## 2014-08-23 ENCOUNTER — Other Ambulatory Visit: Payer: Self-pay

## 2014-08-23 MED ORDER — DICLOFENAC SODIUM 75 MG PO TBEC: 75.0000 mg | DELAYED_RELEASE_TABLET | Freq: Two times a day (BID) | ORAL | Status: DC | PRN

## 2014-09-24 ENCOUNTER — Telehealth: Payer: Self-pay | Admitting: Internal Medicine

## 2014-09-24 NOTE — Telephone Encounter (Signed)
Debbie called in from [facility] and they need the patients  PSA results sent over.    [facility]   [phone]

## 2015-01-13 ENCOUNTER — Ambulatory Visit: Payer: Medicare Other | Admitting: Internal Medicine

## 2015-04-06 DIAGNOSIS — I10 Essential (primary) hypertension: Secondary | ICD-10-CM | POA: Diagnosis not present

## 2015-07-04 DIAGNOSIS — L989 Disorder of the skin and subcutaneous tissue, unspecified: Secondary | ICD-10-CM | POA: Diagnosis not present

## 2015-07-12 DIAGNOSIS — I10 Essential (primary) hypertension: Secondary | ICD-10-CM | POA: Diagnosis not present

## 2015-07-12 DIAGNOSIS — M150 Primary generalized (osteo)arthritis: Secondary | ICD-10-CM | POA: Diagnosis not present

## 2015-07-12 DIAGNOSIS — Z1389 Encounter for screening for other disorder: Secondary | ICD-10-CM | POA: Diagnosis not present

## 2015-07-12 DIAGNOSIS — Z0000 Encounter for general adult medical examination without abnormal findings: Secondary | ICD-10-CM | POA: Diagnosis not present

## 2015-07-19 DIAGNOSIS — Z1212 Encounter for screening for malignant neoplasm of rectum: Secondary | ICD-10-CM | POA: Diagnosis not present

## 2015-07-19 DIAGNOSIS — L570 Actinic keratosis: Secondary | ICD-10-CM | POA: Diagnosis not present

## 2015-07-19 DIAGNOSIS — L728 Other follicular cysts of the skin and subcutaneous tissue: Secondary | ICD-10-CM | POA: Diagnosis not present

## 2015-11-09 DIAGNOSIS — I10 Essential (primary) hypertension: Secondary | ICD-10-CM | POA: Diagnosis not present

## 2015-11-09 DIAGNOSIS — Z23 Encounter for immunization: Secondary | ICD-10-CM | POA: Diagnosis not present

## 2015-11-09 DIAGNOSIS — J302 Other seasonal allergic rhinitis: Secondary | ICD-10-CM | POA: Diagnosis not present

## 2015-11-22 DIAGNOSIS — J302 Other seasonal allergic rhinitis: Secondary | ICD-10-CM | POA: Diagnosis not present

## 2016-03-07 DIAGNOSIS — G629 Polyneuropathy, unspecified: Secondary | ICD-10-CM | POA: Diagnosis not present

## 2016-03-07 DIAGNOSIS — I10 Essential (primary) hypertension: Secondary | ICD-10-CM | POA: Diagnosis not present

## 2016-03-07 DIAGNOSIS — R200 Anesthesia of skin: Secondary | ICD-10-CM | POA: Diagnosis not present

## 2016-03-14 DIAGNOSIS — H52223 Regular astigmatism, bilateral: Secondary | ICD-10-CM | POA: Diagnosis not present

## 2016-03-14 DIAGNOSIS — H43393 Other vitreous opacities, bilateral: Secondary | ICD-10-CM | POA: Diagnosis not present

## 2016-03-14 DIAGNOSIS — Z961 Presence of intraocular lens: Secondary | ICD-10-CM | POA: Diagnosis not present

## 2016-03-14 DIAGNOSIS — H353121 Nonexudative age-related macular degeneration, left eye, early dry stage: Secondary | ICD-10-CM | POA: Diagnosis not present

## 2016-03-14 DIAGNOSIS — H59031 Cystoid macular edema following cataract surgery, right eye: Secondary | ICD-10-CM | POA: Diagnosis not present

## 2016-03-14 DIAGNOSIS — H35373 Puckering of macula, bilateral: Secondary | ICD-10-CM | POA: Diagnosis not present

## 2016-03-14 DIAGNOSIS — Z9842 Cataract extraction status, left eye: Secondary | ICD-10-CM | POA: Diagnosis not present

## 2016-03-14 DIAGNOSIS — Z9841 Cataract extraction status, right eye: Secondary | ICD-10-CM | POA: Diagnosis not present

## 2016-03-14 DIAGNOSIS — H43813 Vitreous degeneration, bilateral: Secondary | ICD-10-CM | POA: Diagnosis not present

## 2016-03-15 DIAGNOSIS — D519 Vitamin B12 deficiency anemia, unspecified: Secondary | ICD-10-CM | POA: Diagnosis not present

## 2016-03-15 DIAGNOSIS — G629 Polyneuropathy, unspecified: Secondary | ICD-10-CM | POA: Diagnosis not present

## 2016-04-05 DIAGNOSIS — D210 Benign neoplasm of connective and other soft tissue of head, face and neck: Secondary | ICD-10-CM | POA: Diagnosis not present

## 2016-04-05 DIAGNOSIS — D510 Vitamin B12 deficiency anemia due to intrinsic factor deficiency: Secondary | ICD-10-CM | POA: Diagnosis not present

## 2016-04-12 DIAGNOSIS — D519 Vitamin B12 deficiency anemia, unspecified: Secondary | ICD-10-CM | POA: Diagnosis not present

## 2016-04-13 DIAGNOSIS — D519 Vitamin B12 deficiency anemia, unspecified: Secondary | ICD-10-CM | POA: Diagnosis not present

## 2016-04-16 DIAGNOSIS — D519 Vitamin B12 deficiency anemia, unspecified: Secondary | ICD-10-CM | POA: Diagnosis not present

## 2016-04-17 DIAGNOSIS — D519 Vitamin B12 deficiency anemia, unspecified: Secondary | ICD-10-CM | POA: Diagnosis not present

## 2016-04-18 DIAGNOSIS — D519 Vitamin B12 deficiency anemia, unspecified: Secondary | ICD-10-CM | POA: Diagnosis not present

## 2016-04-19 DIAGNOSIS — D519 Vitamin B12 deficiency anemia, unspecified: Secondary | ICD-10-CM | POA: Diagnosis not present

## 2016-04-19 DIAGNOSIS — M5416 Radiculopathy, lumbar region: Secondary | ICD-10-CM | POA: Diagnosis not present

## 2016-04-20 DIAGNOSIS — D519 Vitamin B12 deficiency anemia, unspecified: Secondary | ICD-10-CM | POA: Diagnosis not present

## 2016-04-26 DIAGNOSIS — H35341 Macular cyst, hole, or pseudohole, right eye: Secondary | ICD-10-CM | POA: Diagnosis not present

## 2016-04-26 DIAGNOSIS — I10 Essential (primary) hypertension: Secondary | ICD-10-CM | POA: Diagnosis not present

## 2016-04-26 DIAGNOSIS — H59031 Cystoid macular edema following cataract surgery, right eye: Secondary | ICD-10-CM | POA: Diagnosis not present

## 2016-04-26 DIAGNOSIS — H53482 Generalized contraction of visual field, left eye: Secondary | ICD-10-CM | POA: Diagnosis not present

## 2016-04-26 DIAGNOSIS — H353121 Nonexudative age-related macular degeneration, left eye, early dry stage: Secondary | ICD-10-CM | POA: Diagnosis not present

## 2016-04-26 DIAGNOSIS — H53452 Other localized visual field defect, left eye: Secondary | ICD-10-CM | POA: Diagnosis not present

## 2016-04-27 DIAGNOSIS — D519 Vitamin B12 deficiency anemia, unspecified: Secondary | ICD-10-CM | POA: Diagnosis not present

## 2016-05-04 DIAGNOSIS — D519 Vitamin B12 deficiency anemia, unspecified: Secondary | ICD-10-CM | POA: Diagnosis not present

## 2016-05-11 DIAGNOSIS — D519 Vitamin B12 deficiency anemia, unspecified: Secondary | ICD-10-CM | POA: Diagnosis not present

## 2016-05-17 DIAGNOSIS — D519 Vitamin B12 deficiency anemia, unspecified: Secondary | ICD-10-CM | POA: Diagnosis not present

## 2016-06-11 DIAGNOSIS — L989 Disorder of the skin and subcutaneous tissue, unspecified: Secondary | ICD-10-CM | POA: Diagnosis not present

## 2016-06-11 DIAGNOSIS — M545 Low back pain: Secondary | ICD-10-CM | POA: Diagnosis not present

## 2016-06-11 DIAGNOSIS — M4856XA Collapsed vertebra, not elsewhere classified, lumbar region, initial encounter for fracture: Secondary | ICD-10-CM | POA: Diagnosis not present

## 2016-06-18 DIAGNOSIS — D519 Vitamin B12 deficiency anemia, unspecified: Secondary | ICD-10-CM | POA: Diagnosis not present

## 2016-07-02 DIAGNOSIS — C44622 Squamous cell carcinoma of skin of right upper limb, including shoulder: Secondary | ICD-10-CM | POA: Diagnosis not present

## 2016-07-17 DIAGNOSIS — C44622 Squamous cell carcinoma of skin of right upper limb, including shoulder: Secondary | ICD-10-CM | POA: Diagnosis not present

## 2016-07-20 DIAGNOSIS — D519 Vitamin B12 deficiency anemia, unspecified: Secondary | ICD-10-CM | POA: Diagnosis not present

## 2016-08-22 DIAGNOSIS — D519 Vitamin B12 deficiency anemia, unspecified: Secondary | ICD-10-CM | POA: Diagnosis not present

## 2016-09-12 DIAGNOSIS — Z23 Encounter for immunization: Secondary | ICD-10-CM | POA: Diagnosis not present

## 2016-09-12 DIAGNOSIS — M4800 Spinal stenosis, site unspecified: Secondary | ICD-10-CM | POA: Diagnosis not present

## 2016-09-12 DIAGNOSIS — M4850XD Collapsed vertebra, not elsewhere classified, site unspecified, subsequent encounter for fracture with routine healing: Secondary | ICD-10-CM | POA: Diagnosis not present

## 2016-09-12 DIAGNOSIS — E559 Vitamin D deficiency, unspecified: Secondary | ICD-10-CM | POA: Diagnosis not present

## 2016-09-24 DIAGNOSIS — D519 Vitamin B12 deficiency anemia, unspecified: Secondary | ICD-10-CM | POA: Diagnosis not present

## 2016-10-25 DIAGNOSIS — D519 Vitamin B12 deficiency anemia, unspecified: Secondary | ICD-10-CM | POA: Diagnosis not present

## 2016-10-29 DIAGNOSIS — H59031 Cystoid macular edema following cataract surgery, right eye: Secondary | ICD-10-CM | POA: Diagnosis not present

## 2016-10-29 DIAGNOSIS — H353131 Nonexudative age-related macular degeneration, bilateral, early dry stage: Secondary | ICD-10-CM | POA: Diagnosis not present

## 2016-10-29 DIAGNOSIS — I10 Essential (primary) hypertension: Secondary | ICD-10-CM | POA: Diagnosis not present

## 2016-11-21 DIAGNOSIS — G629 Polyneuropathy, unspecified: Secondary | ICD-10-CM | POA: Diagnosis not present

## 2016-11-21 DIAGNOSIS — S32030G Wedge compression fracture of third lumbar vertebra, subsequent encounter for fracture with delayed healing: Secondary | ICD-10-CM | POA: Diagnosis not present

## 2016-11-21 DIAGNOSIS — Z6829 Body mass index (BMI) 29.0-29.9, adult: Secondary | ICD-10-CM | POA: Diagnosis not present

## 2016-11-21 DIAGNOSIS — M5416 Radiculopathy, lumbar region: Secondary | ICD-10-CM | POA: Diagnosis not present

## 2016-11-26 DIAGNOSIS — D519 Vitamin B12 deficiency anemia, unspecified: Secondary | ICD-10-CM | POA: Diagnosis not present

## 2016-12-12 DIAGNOSIS — J302 Other seasonal allergic rhinitis: Secondary | ICD-10-CM | POA: Diagnosis not present

## 2016-12-12 DIAGNOSIS — N400 Enlarged prostate without lower urinary tract symptoms: Secondary | ICD-10-CM | POA: Diagnosis not present

## 2016-12-12 DIAGNOSIS — M5416 Radiculopathy, lumbar region: Secondary | ICD-10-CM | POA: Diagnosis not present

## 2016-12-12 DIAGNOSIS — M150 Primary generalized (osteo)arthritis: Secondary | ICD-10-CM | POA: Diagnosis not present

## 2016-12-12 DIAGNOSIS — M4850XD Collapsed vertebra, not elsewhere classified, site unspecified, subsequent encounter for fracture with routine healing: Secondary | ICD-10-CM | POA: Diagnosis not present

## 2016-12-12 DIAGNOSIS — E559 Vitamin D deficiency, unspecified: Secondary | ICD-10-CM | POA: Diagnosis not present

## 2016-12-12 DIAGNOSIS — I10 Essential (primary) hypertension: Secondary | ICD-10-CM | POA: Diagnosis not present

## 2016-12-12 DIAGNOSIS — E538 Deficiency of other specified B group vitamins: Secondary | ICD-10-CM | POA: Diagnosis not present

## 2016-12-12 DIAGNOSIS — Z1389 Encounter for screening for other disorder: Secondary | ICD-10-CM | POA: Diagnosis not present

## 2016-12-12 DIAGNOSIS — Z0000 Encounter for general adult medical examination without abnormal findings: Secondary | ICD-10-CM | POA: Diagnosis not present

## 2016-12-28 DIAGNOSIS — M85852 Other specified disorders of bone density and structure, left thigh: Secondary | ICD-10-CM | POA: Diagnosis not present

## 2016-12-28 DIAGNOSIS — M8589 Other specified disorders of bone density and structure, multiple sites: Secondary | ICD-10-CM | POA: Diagnosis not present

## 2016-12-28 DIAGNOSIS — D519 Vitamin B12 deficiency anemia, unspecified: Secondary | ICD-10-CM | POA: Diagnosis not present

## 2017-01-30 DIAGNOSIS — D519 Vitamin B12 deficiency anemia, unspecified: Secondary | ICD-10-CM | POA: Diagnosis not present

## 2017-02-04 ENCOUNTER — Ambulatory Visit (INDEPENDENT_AMBULATORY_CARE_PROVIDER_SITE_OTHER): Payer: Medicare HMO | Admitting: Neurology

## 2017-02-04 ENCOUNTER — Encounter: Payer: Self-pay | Admitting: Neurology

## 2017-02-04 ENCOUNTER — Other Ambulatory Visit (INDEPENDENT_AMBULATORY_CARE_PROVIDER_SITE_OTHER): Payer: Medicare HMO

## 2017-02-04 VITALS — BP 118/70 | HR 62 | Ht 69.0 in | Wt 200.2 lb

## 2017-02-04 DIAGNOSIS — G629 Polyneuropathy, unspecified: Secondary | ICD-10-CM

## 2017-02-04 DIAGNOSIS — G2581 Restless legs syndrome: Secondary | ICD-10-CM

## 2017-02-04 LAB — CBC
HEMATOCRIT: 39.5 % (ref 39.0–52.0)
Hemoglobin: 13.5 g/dL (ref 13.0–17.0)
MCHC: 34.2 g/dL (ref 30.0–36.0)
MCV: 95.2 fl (ref 78.0–100.0)
Platelets: 273 10*3/uL (ref 150.0–400.0)
RBC: 4.15 Mil/uL — ABNORMAL LOW (ref 4.22–5.81)
RDW: 12.6 % (ref 11.5–15.5)
WBC: 5.7 10*3/uL (ref 4.0–10.5)

## 2017-02-04 LAB — COMPREHENSIVE METABOLIC PANEL
ALBUMIN: 4.1 g/dL (ref 3.5–5.2)
ALK PHOS: 50 U/L (ref 39–117)
ALT: 12 U/L (ref 0–53)
AST: 14 U/L (ref 0–37)
BUN: 13 mg/dL (ref 6–23)
CHLORIDE: 101 meq/L (ref 96–112)
CO2: 26 mEq/L (ref 19–32)
CREATININE: 0.92 mg/dL (ref 0.40–1.50)
Calcium: 9 mg/dL (ref 8.4–10.5)
GFR: 83.5 mL/min (ref >60.00)
GLUCOSE: 92 mg/dL (ref 70–99)
Potassium: 4.4 mEq/L (ref 3.5–5.1)
SODIUM: 134 meq/L — AB (ref 135–145)
TOTAL PROTEIN: 6.7 g/dL (ref 6.0–8.3)
Total Bilirubin: 0.6 mg/dL (ref 0.2–1.2)

## 2017-02-04 LAB — TSH: TSH: 1.33 u[IU]/mL (ref 0.35–4.50)

## 2017-02-04 LAB — VITAMIN B12: Vitamin B-12: 727 pg/mL (ref 211–911)

## 2017-02-04 LAB — FERRITIN: FERRITIN: 166.7 ng/mL (ref 22.0–322.0)
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
## 2017-02-04 NOTE — Patient Instructions (Signed)
1.  Check blood work 2.  Continue gabapentin 300mg  in the morning and increase gabapentin to 600mg  at bedtime 3.  We will try to get records from your prior nerve testing  Return to clinic in 4 months

## 2017-02-06 LAB — COPPER, SERUM: COPPER: 93 ug/dL (ref 70–175)

## 2017-02-06 LAB — PROTEIN ELECTROPHORESIS, SERUM
ALPHA-1-GLOBULIN: 0.3 g/dL (ref 0.2–0.3)
ALPHA-2-GLOBULIN: 0.7 g/dL (ref 0.5–0.9)
Albumin ELP: 3.7 g/dL — ABNORMAL LOW (ref 3.8–4.8)
BETA 2: 0.3 g/dL (ref 0.2–0.5)
BETA GLOBULIN: 0.4 g/dL (ref 0.4–0.6)
GAMMA GLOBULIN: 0.9 g/dL (ref 0.8–1.7)
Total Protein, Serum Electrophoresis: 6.3 g/dL (ref 6.1–8.1)

## 2017-02-08 LAB — IMMUNOFIXATION ELECTROPHORESIS
IgA: 225 mg/dL (ref 81–463)
IgG (Immunoglobin G), Serum: 951 mg/dL (ref 694–1618)
IgM, Serum: 72 mg/dL (ref 48–271)

## 2017-02-08 LAB — VITAMIN B1: VITAMIN B1 (THIAMINE): 12 nmol/L (ref 8–30)

## 2017-02-11 ENCOUNTER — Telehealth: Payer: Self-pay | Admitting: Neurology

## 2017-02-11 NOTE — Telephone Encounter (Signed)
EMG dated 04/19/2016 at [facility]:  No evidence of neuropathy.  Left chronic L4 radiculopathy.   Please inform patient that I rec'd his EMG from his previous neurologist which did not show neuropathy. He may have some nerve impingement in the back, but that would not explain the diffuse nature of his discomfort in the legs.   His labs also returned normal.  Symptoms are most likely due to restless leg syndrome.  Continue gabapentin for 300mg  in the morning and 600mg  at bedtime.    Jt Brabec K. Posey Pronto, DO

## 2017-02-11 NOTE — Telephone Encounter (Signed)
Patient given results and instructions per Dr. Posey Pronto and he agreed with plan.

## 2017-02-27 DIAGNOSIS — Z87891 Personal history of nicotine dependence: Secondary | ICD-10-CM | POA: Diagnosis not present

## 2017-02-27 DIAGNOSIS — Z6829 Body mass index (BMI) 29.0-29.9, adult: Secondary | ICD-10-CM | POA: Diagnosis not present

## 2017-02-27 DIAGNOSIS — I10 Essential (primary) hypertension: Secondary | ICD-10-CM | POA: Diagnosis not present

## 2017-02-27 DIAGNOSIS — M810 Age-related osteoporosis without current pathological fracture: Secondary | ICD-10-CM | POA: Diagnosis not present

## 2017-03-04 DIAGNOSIS — D519 Vitamin B12 deficiency anemia, unspecified: Secondary | ICD-10-CM | POA: Diagnosis not present

## 2017-03-13 DIAGNOSIS — M810 Age-related osteoporosis without current pathological fracture: Secondary | ICD-10-CM | POA: Diagnosis not present

## 2017-03-13 DIAGNOSIS — I10 Essential (primary) hypertension: Secondary | ICD-10-CM | POA: Diagnosis not present

## 2017-03-15 DIAGNOSIS — H5202 Hypermetropia, left eye: Secondary | ICD-10-CM | POA: Diagnosis not present

## 2017-03-15 DIAGNOSIS — H59031 Cystoid macular edema following cataract surgery, right eye: Secondary | ICD-10-CM | POA: Diagnosis not present

## 2017-03-15 DIAGNOSIS — H524 Presbyopia: Secondary | ICD-10-CM | POA: Diagnosis not present

## 2017-03-15 DIAGNOSIS — H5211 Myopia, right eye: Secondary | ICD-10-CM | POA: Diagnosis not present

## 2017-03-15 DIAGNOSIS — H52223 Regular astigmatism, bilateral: Secondary | ICD-10-CM | POA: Diagnosis not present

## 2017-03-15 DIAGNOSIS — Z961 Presence of intraocular lens: Secondary | ICD-10-CM | POA: Diagnosis not present

## 2017-03-15 DIAGNOSIS — H353121 Nonexudative age-related macular degeneration, left eye, early dry stage: Secondary | ICD-10-CM | POA: Diagnosis not present

## 2017-03-15 DIAGNOSIS — Z9841 Cataract extraction status, right eye: Secondary | ICD-10-CM | POA: Diagnosis not present

## 2017-03-15 DIAGNOSIS — Z9842 Cataract extraction status, left eye: Secondary | ICD-10-CM | POA: Diagnosis not present

## 2017-04-08 DIAGNOSIS — D519 Vitamin B12 deficiency anemia, unspecified: Secondary | ICD-10-CM | POA: Diagnosis not present

## 2017-05-10 DIAGNOSIS — D519 Vitamin B12 deficiency anemia, unspecified: Secondary | ICD-10-CM | POA: Diagnosis not present

## 2017-05-23 DIAGNOSIS — I10 Essential (primary) hypertension: Secondary | ICD-10-CM | POA: Diagnosis not present

## 2017-05-23 DIAGNOSIS — H47092 Other disorders of optic nerve, not elsewhere classified, left eye: Secondary | ICD-10-CM | POA: Diagnosis not present

## 2017-05-23 DIAGNOSIS — H4742 Disorders of optic chiasm in (due to) neoplasm: Secondary | ICD-10-CM | POA: Diagnosis not present

## 2017-05-23 DIAGNOSIS — H47522 Disorders of visual pathways in (due to) neoplasm, left side: Secondary | ICD-10-CM | POA: Diagnosis not present

## 2017-05-27 DIAGNOSIS — Z0100 Encounter for examination of eyes and vision without abnormal findings: Secondary | ICD-10-CM | POA: Diagnosis not present

## 2017-06-05 ENCOUNTER — Encounter: Payer: Self-pay | Admitting: Neurology

## 2017-06-05 ENCOUNTER — Ambulatory Visit (INDEPENDENT_AMBULATORY_CARE_PROVIDER_SITE_OTHER): Payer: Medicare HMO | Admitting: Neurology

## 2017-06-05 VITALS — BP 128/68 | HR 62 | Ht 69.0 in | Wt 202.4 lb

## 2017-06-05 DIAGNOSIS — M542 Cervicalgia: Secondary | ICD-10-CM

## 2017-06-05 DIAGNOSIS — G2581 Restless legs syndrome: Secondary | ICD-10-CM

## 2017-06-05 DIAGNOSIS — G629 Polyneuropathy, unspecified: Secondary | ICD-10-CM

## 2017-06-05 MED ORDER — ROPINIROLE HCL 0.25 MG PO TABS: ORAL_TABLET | ORAL | 5 refills | Status: DC

## 2017-06-05 MED ORDER — TIZANIDINE HCL 4 MG PO TABS: 4.0000 mg | ORAL_TABLET | Freq: Every evening | ORAL | 3 refills | Status: DC | PRN

## 2017-06-05 NOTE — Progress Notes (Signed)
Follow-up Visit   Date: 06/05/17    Andrew Montgomery MRN: [id number] DOB: 04-06-1932   Interim History: Andrew Montgomery is a 83 y.o. left-handed Caucasian male with hypertension, history of left brain tumor (meningioma) resection (2010), and BPH returning to the clinic for follow-up of restless leg syndrome and new complaints of left neck pain.  The patient was accompanied to the clinic by self.  History of present illness: Starting around 2012, he fell off a ladder and fractured his left ankle.  He began having a burning and crawling sensation of the lower leg, which started on the left and over years, involved the right.  Symptoms are worse when he is sitting or laying down.  He often feels that the legs are restless and tries to move his legs for comfort.  Some nights, he wakes up and walks around because he feels the the bed is hurting him.  He takes gabapentin 300mg  twice daily and provides about 50% relief.  He also has numbness over the legs, which is worse if he crosses his legs. It is quickly improved with standing. He denies any imbalance, falls, or weakness.  He walks unassisted.    UPDATE 06/05/2017:  He is here for follow-up visit and reports having worsening symptoms of RLS.  He is unable to fall asleep at bedtime and the symptoms can also be present when at rest during the day.  He is taking gabapentin 300mg  in the morning and 600mg  at bedtime.  He has not been on a dopamine agonist.  His EMG was received from 2017 [place] [facility] which did not show evidence of neuropathy.    He complains of left sided neck pain, described as sharp pain.  He tried diclofenac which helped some.  He feels as if the "joint needs oil".  He denies numbness/tingling of the left arm or neck.    Medications:  Current Outpatient Prescriptions on File Prior to Visit  Medication Sig Dispense Refill  . amLODipine (NORVASC) 10 MG tablet Take 1 tablet (10 mg total) by mouth daily. 90 tablet  3  . bisoprolol-hydrochlorothiazide (ZIAC) 2.5-6.25 MG per tablet Take 1 tablet by mouth daily. 90 tablet 3  . diclofenac (VOLTAREN) 75 MG EC tablet Take 1 tablet (75 mg total) by mouth 2 (two) times daily as needed. 60 tablet 5  . gabapentin (NEURONTIN) 300 MG capsule     . lisinopril (PRINIVIL,ZESTRIL) 20 MG tablet Take 1 tablet (20 mg total) by mouth daily. 90 tablet 3  . tamsulosin (FLOMAX) 0.4 MG CAPS capsule Take 1 capsule (0.4 mg total) by mouth daily. (Patient not taking: Reported on 02/04/2017) 90 capsule 3   No current facility-administered medications on file prior to visit.     Allergies: No Known Allergies  Review of Systems:  CONSTITUTIONAL: No fevers, chills, night sweats, or weight loss.  EYES: No visual changes or eye pain ENT: No hearing changes.  No history of nose bleeds.   RESPIRATORY: No cough, wheezing and shortness of breath.   CARDIOVASCULAR: Negative for chest pain, and palpitations.   GI: Negative for abdominal discomfort, blood in stools or black stools.  No recent change in bowel habits.   GU:  No history of incontinence.   MUSCLOSKELETAL: No history of joint pain or swelling.  No myalgias.   SKIN: Negative for lesions, rash, and itching.   ENDOCRINE: Negative for cold or heat intolerance, polydipsia or goiter.   PSYCH:  No depression or anxiety symptoms.  NEURO: As Above.   Vital Signs:  BP 128/68   Pulse 62   Ht 5\' 9"  (1.753 m)   Wt 202 lb 7 oz (91.8 kg)   SpO2 98%   BMI 29.89 kg/m   Neurological Exam: MENTAL STATUS including orientation to time, place, person, recent and remote memory, attention span and concentration, language, and fund of knowledge is normal.  Speech is not dysarthric.  CRANIAL NERVES:  Face is symmetric.   MOTOR:  Motor strength is 5/5 in all extremities.  Neck ROM is limited with left rotation and extension, which provokes his neck pain.  No pronator drift.  Tone is normal.    MSRs:  Reflexes are 2+/4 throughout, except  reduced at the ankles bilaterally.  SENSORY:  Intact to temperature, vibration, and light touch in the arms  COORDINATION/GAIT:  Gait narrow based and stable.   Data: EMG dated 04/19/2016 at [facility]:  No evidence of neuropathy.  Left chronic L4 radiculopathy.   MRI brain wwo contrast 04/01/2009: 1. 2.5 x 3.3 cm dural based mass lesion centered at the left greater wing of the sphenoid and entering the left orbit. This is most compatible with a meningioma. A dural based metastasis is considered, in this case most likely prostate cancer. The idea of a metastasis rather than meningioma is considered highly unlikely. 2. No acute intracranial abnormality.  Labs 02/04/2017:  SPEP no M protein, vitamin B12 727, vitamin B1 12, TSH 1.33, ferritin 166, copper 93  IMPRESSION/PLAN:  1.  Restless leg syndrome, worsening  - Start ropinirole 0.25mg  at bedtime x 1 week, then increase to 0.5mg  at bedtime. Side effects discussed  - Continue gabapentin 600mg  at bedtime, 300mg  in the morning  2.  Left cervical radiculopathy and neck pain  - Start tizanidine 4mg  at bedtime for neck pain  - Start neck physiotherapy  - Patient to call with update in 2 weeks, if no improvement, will offer course of prednisone  Return to clinic in 4 months  The duration of this appointment visit was 30 minutes of face-to-face time with the patient.  Greater than 50% of this time was spent in counseling, explanation of diagnosis, planning of further management, and coordination of care.   Thank you for allowing me to participate in patient's care.  If I can answer any additional questions, I would be pleased to do so.    Sincerely,    Quantina Dershem K. Posey Pronto, DO

## 2017-06-05 NOTE — Patient Instructions (Addendum)
Start tizanidine 4mg  at bedtime for neck pain Start neck physiotherapy  Start ropinirole 0.25mg  at bedtime x 1 week, then increase to 0.5mg  at bedtime Continue gabapentin 600mg  at bedtime, 300mg  in the morning  Call with an update in 2 weeks  Return to clinic in 4 months

## 2017-06-12 DIAGNOSIS — M542 Cervicalgia: Secondary | ICD-10-CM | POA: Diagnosis not present

## 2017-06-12 DIAGNOSIS — M6281 Muscle weakness (generalized): Secondary | ICD-10-CM | POA: Diagnosis not present

## 2017-06-14 DIAGNOSIS — E538 Deficiency of other specified B group vitamins: Secondary | ICD-10-CM | POA: Diagnosis not present

## 2017-06-14 DIAGNOSIS — M542 Cervicalgia: Secondary | ICD-10-CM | POA: Diagnosis not present

## 2017-06-14 DIAGNOSIS — M150 Primary generalized (osteo)arthritis: Secondary | ICD-10-CM | POA: Diagnosis not present

## 2017-06-14 DIAGNOSIS — I10 Essential (primary) hypertension: Secondary | ICD-10-CM | POA: Diagnosis not present

## 2017-06-14 DIAGNOSIS — M6281 Muscle weakness (generalized): Secondary | ICD-10-CM | POA: Diagnosis not present

## 2017-06-20 DIAGNOSIS — M542 Cervicalgia: Secondary | ICD-10-CM | POA: Diagnosis not present

## 2017-06-20 DIAGNOSIS — M6281 Muscle weakness (generalized): Secondary | ICD-10-CM | POA: Diagnosis not present

## 2017-06-25 DIAGNOSIS — M542 Cervicalgia: Secondary | ICD-10-CM | POA: Diagnosis not present

## 2017-06-25 DIAGNOSIS — M6281 Muscle weakness (generalized): Secondary | ICD-10-CM | POA: Diagnosis not present

## 2017-06-28 DIAGNOSIS — M542 Cervicalgia: Secondary | ICD-10-CM | POA: Diagnosis not present

## 2017-06-28 DIAGNOSIS — M6281 Muscle weakness (generalized): Secondary | ICD-10-CM | POA: Diagnosis not present

## 2017-07-03 DIAGNOSIS — M6281 Muscle weakness (generalized): Secondary | ICD-10-CM | POA: Diagnosis not present

## 2017-07-03 DIAGNOSIS — M542 Cervicalgia: Secondary | ICD-10-CM | POA: Diagnosis not present

## 2017-07-05 DIAGNOSIS — M6281 Muscle weakness (generalized): Secondary | ICD-10-CM | POA: Diagnosis not present

## 2017-07-05 DIAGNOSIS — M542 Cervicalgia: Secondary | ICD-10-CM | POA: Diagnosis not present

## 2017-07-09 DIAGNOSIS — M6281 Muscle weakness (generalized): Secondary | ICD-10-CM | POA: Diagnosis not present

## 2017-07-09 DIAGNOSIS — M542 Cervicalgia: Secondary | ICD-10-CM | POA: Diagnosis not present

## 2017-07-12 DIAGNOSIS — M542 Cervicalgia: Secondary | ICD-10-CM | POA: Diagnosis not present

## 2017-07-12 DIAGNOSIS — M6281 Muscle weakness (generalized): Secondary | ICD-10-CM | POA: Diagnosis not present

## 2017-07-16 DIAGNOSIS — E538 Deficiency of other specified B group vitamins: Secondary | ICD-10-CM | POA: Diagnosis not present

## 2017-08-20 DIAGNOSIS — E538 Deficiency of other specified B group vitamins: Secondary | ICD-10-CM | POA: Diagnosis not present

## 2017-09-17 DIAGNOSIS — I10 Essential (primary) hypertension: Secondary | ICD-10-CM | POA: Diagnosis not present

## 2017-09-17 DIAGNOSIS — Z23 Encounter for immunization: Secondary | ICD-10-CM | POA: Diagnosis not present

## 2017-09-23 DIAGNOSIS — E538 Deficiency of other specified B group vitamins: Secondary | ICD-10-CM | POA: Diagnosis not present

## 2017-10-18 ENCOUNTER — Ambulatory Visit: Payer: Medicare HMO | Admitting: Neurology

## 2017-10-25 DIAGNOSIS — E538 Deficiency of other specified B group vitamins: Secondary | ICD-10-CM | POA: Diagnosis not present

## 2017-11-25 DIAGNOSIS — E538 Deficiency of other specified B group vitamins: Secondary | ICD-10-CM | POA: Diagnosis not present

## 2017-12-20 DIAGNOSIS — E663 Overweight: Secondary | ICD-10-CM | POA: Diagnosis not present

## 2017-12-20 DIAGNOSIS — M4850XA Collapsed vertebra, not elsewhere classified, site unspecified, initial encounter for fracture: Secondary | ICD-10-CM | POA: Diagnosis not present

## 2017-12-20 DIAGNOSIS — Z0000 Encounter for general adult medical examination without abnormal findings: Secondary | ICD-10-CM | POA: Diagnosis not present

## 2017-12-20 DIAGNOSIS — E559 Vitamin D deficiency, unspecified: Secondary | ICD-10-CM | POA: Diagnosis not present

## 2017-12-20 DIAGNOSIS — M810 Age-related osteoporosis without current pathological fracture: Secondary | ICD-10-CM | POA: Diagnosis not present

## 2017-12-20 DIAGNOSIS — J302 Other seasonal allergic rhinitis: Secondary | ICD-10-CM | POA: Diagnosis not present

## 2017-12-20 DIAGNOSIS — E538 Deficiency of other specified B group vitamins: Secondary | ICD-10-CM | POA: Diagnosis not present

## 2017-12-20 DIAGNOSIS — K219 Gastro-esophageal reflux disease without esophagitis: Secondary | ICD-10-CM | POA: Diagnosis not present

## 2017-12-20 DIAGNOSIS — M150 Primary generalized (osteo)arthritis: Secondary | ICD-10-CM | POA: Diagnosis not present

## 2017-12-20 DIAGNOSIS — I10 Essential (primary) hypertension: Secondary | ICD-10-CM | POA: Diagnosis not present

## 2017-12-20 DIAGNOSIS — M5416 Radiculopathy, lumbar region: Secondary | ICD-10-CM | POA: Diagnosis not present

## 2017-12-26 DIAGNOSIS — E538 Deficiency of other specified B group vitamins: Secondary | ICD-10-CM | POA: Diagnosis not present

## 2018-01-27 DIAGNOSIS — E538 Deficiency of other specified B group vitamins: Secondary | ICD-10-CM | POA: Diagnosis not present

## 2018-02-25 DIAGNOSIS — E538 Deficiency of other specified B group vitamins: Secondary | ICD-10-CM | POA: Diagnosis not present

## 2018-03-20 DIAGNOSIS — I10 Essential (primary) hypertension: Secondary | ICD-10-CM | POA: Diagnosis not present

## 2018-04-01 DIAGNOSIS — E538 Deficiency of other specified B group vitamins: Secondary | ICD-10-CM | POA: Diagnosis not present

## 2018-04-16 DIAGNOSIS — H35372 Puckering of macula, left eye: Secondary | ICD-10-CM | POA: Diagnosis not present

## 2018-04-16 DIAGNOSIS — H26493 Other secondary cataract, bilateral: Secondary | ICD-10-CM | POA: Diagnosis not present

## 2018-04-16 DIAGNOSIS — H0520 Unspecified exophthalmos: Secondary | ICD-10-CM | POA: Diagnosis not present

## 2018-04-23 DIAGNOSIS — H0520 Unspecified exophthalmos: Secondary | ICD-10-CM | POA: Diagnosis not present

## 2018-05-02 DIAGNOSIS — E538 Deficiency of other specified B group vitamins: Secondary | ICD-10-CM | POA: Diagnosis not present

## 2018-05-13 DIAGNOSIS — R202 Paresthesia of skin: Secondary | ICD-10-CM | POA: Diagnosis not present

## 2018-05-13 DIAGNOSIS — R200 Anesthesia of skin: Secondary | ICD-10-CM | POA: Diagnosis not present

## 2018-05-13 DIAGNOSIS — M542 Cervicalgia: Secondary | ICD-10-CM | POA: Diagnosis not present

## 2018-05-13 DIAGNOSIS — Z87898 Personal history of other specified conditions: Secondary | ICD-10-CM | POA: Diagnosis not present

## 2018-05-14 DIAGNOSIS — M542 Cervicalgia: Secondary | ICD-10-CM | POA: Diagnosis not present

## 2018-05-14 DIAGNOSIS — M47812 Spondylosis without myelopathy or radiculopathy, cervical region: Secondary | ICD-10-CM | POA: Diagnosis not present

## 2018-06-02 DIAGNOSIS — H0520 Unspecified exophthalmos: Secondary | ICD-10-CM | POA: Diagnosis not present

## 2018-06-02 DIAGNOSIS — M4722 Other spondylosis with radiculopathy, cervical region: Secondary | ICD-10-CM | POA: Diagnosis not present

## 2018-06-02 DIAGNOSIS — M4802 Spinal stenosis, cervical region: Secondary | ICD-10-CM | POA: Diagnosis not present

## 2018-06-02 DIAGNOSIS — D496 Neoplasm of unspecified behavior of brain: Secondary | ICD-10-CM | POA: Diagnosis not present

## 2018-06-02 DIAGNOSIS — M5023 Other cervical disc displacement, cervicothoracic region: Secondary | ICD-10-CM | POA: Diagnosis not present

## 2018-06-03 DIAGNOSIS — E538 Deficiency of other specified B group vitamins: Secondary | ICD-10-CM | POA: Diagnosis not present

## 2018-07-03 DIAGNOSIS — M9981 Other biomechanical lesions of cervical region: Secondary | ICD-10-CM | POA: Diagnosis not present

## 2018-07-03 DIAGNOSIS — E538 Deficiency of other specified B group vitamins: Secondary | ICD-10-CM | POA: Diagnosis not present

## 2018-07-03 DIAGNOSIS — D496 Neoplasm of unspecified behavior of brain: Secondary | ICD-10-CM | POA: Diagnosis not present

## 2018-07-29 DIAGNOSIS — I10 Essential (primary) hypertension: Secondary | ICD-10-CM | POA: Diagnosis not present

## 2018-07-29 DIAGNOSIS — Z6828 Body mass index (BMI) 28.0-28.9, adult: Secondary | ICD-10-CM | POA: Diagnosis not present

## 2018-07-29 DIAGNOSIS — D320 Benign neoplasm of cerebral meninges: Secondary | ICD-10-CM | POA: Diagnosis not present

## 2018-08-04 DIAGNOSIS — E538 Deficiency of other specified B group vitamins: Secondary | ICD-10-CM | POA: Diagnosis not present

## 2018-08-19 ENCOUNTER — Other Ambulatory Visit: Payer: Self-pay | Admitting: Radiation Therapy

## 2018-08-20 ENCOUNTER — Other Ambulatory Visit: Payer: Medicare HMO

## 2018-08-20 ENCOUNTER — Other Ambulatory Visit: Payer: Self-pay | Admitting: Radiation Therapy

## 2018-08-20 DIAGNOSIS — D329 Benign neoplasm of meninges, unspecified: Secondary | ICD-10-CM

## 2018-08-21 NOTE — Progress Notes (Signed)
Brain and Spine Tumor Board Documentation  MARCANTHONY SLEIGHT was presented by Cecil Cobbs, MD at Brain and Spine Tumor Board on 08/21/2018, which included representatives from neuro oncology, radiation oncology, surgical oncology, radiology, pathology, navigation.  Dennard was presented as a current patient with history of the following treatments:  .  Additionally, we reviewed previous medical and familial history, history of present illness, and recent lab results along with all available histopathologic and imaging studies. The tumor board considered available treatment options and made the following recommendations:  Radiation therapy (primary modality)  Conventional RT vs SRS to residual tumor.  Follow up with orbital CT-head with contrast to assess for bone invasion.  Tumor board is a meeting of clinicians from various specialty areas who evaluate and discuss patients for whom a multidisciplinary approach is being considered. Final determinations in the plan of care are those of the provider(s). The responsibility for follow up of recommendations given during tumor board is that of the provider.   Today's extended care, comprehensive team conference, Jeffie was not present for the discussion and was not examined.

## 2018-08-26 ENCOUNTER — Other Ambulatory Visit: Payer: Self-pay | Admitting: Radiation Therapy

## 2018-08-30 ENCOUNTER — Ambulatory Visit
Admission: RE | Admit: 2018-08-30 | Discharge: 2018-08-30 | Disposition: A | Discharge status: Home / Self Care | Payer: Medicare HMO | Source: Ambulatory Visit | Attending: Radiation Oncology | Admitting: Radiation Oncology

## 2018-08-30 DIAGNOSIS — D329 Benign neoplasm of meninges, unspecified: Secondary | ICD-10-CM

## 2018-08-30 DIAGNOSIS — D320 Benign neoplasm of cerebral meninges: Secondary | ICD-10-CM | POA: Diagnosis not present

## 2018-08-30 MED ORDER — GADOBENATE DIMEGLUMINE 529 MG/ML IV SOLN
20.0000 mL | Freq: Once | INTRAVENOUS | Status: AC | PRN
  Administered 2018-08-30: 20 mL via INTRAVENOUS

## 2018-09-01 ENCOUNTER — Other Ambulatory Visit: Payer: Self-pay

## 2018-09-01 ENCOUNTER — Ambulatory Visit
Admission: RE | Admit: 2018-09-01 | Discharge: 2018-09-01 | Disposition: A | Discharge status: Home / Self Care | Payer: Medicare HMO | Source: Ambulatory Visit | Attending: Radiation Oncology | Admitting: Radiation Oncology

## 2018-09-01 ENCOUNTER — Inpatient Hospital Stay: Payer: Medicare HMO | Attending: Radiation Oncology

## 2018-09-01 ENCOUNTER — Encounter: Payer: Self-pay | Admitting: Radiation Oncology

## 2018-09-01 VITALS — BP 136/57 | HR 53 | Temp 98.0°F | Resp 20 | Ht 69.0 in | Wt 203.2 lb

## 2018-09-01 DIAGNOSIS — D329 Benign neoplasm of meninges, unspecified: Secondary | ICD-10-CM

## 2018-09-01 DIAGNOSIS — K219 Gastro-esophageal reflux disease without esophagitis: Secondary | ICD-10-CM | POA: Insufficient documentation

## 2018-09-01 DIAGNOSIS — H5462 Unqualified visual loss, left eye, normal vision right eye: Secondary | ICD-10-CM | POA: Insufficient documentation

## 2018-09-01 DIAGNOSIS — Z79899 Other long term (current) drug therapy: Secondary | ICD-10-CM | POA: Diagnosis not present

## 2018-09-01 DIAGNOSIS — F329 Major depressive disorder, single episode, unspecified: Secondary | ICD-10-CM | POA: Diagnosis not present

## 2018-09-01 DIAGNOSIS — H05242 Constant exophthalmos, left eye: Secondary | ICD-10-CM | POA: Diagnosis not present

## 2018-09-01 DIAGNOSIS — I10 Essential (primary) hypertension: Secondary | ICD-10-CM | POA: Insufficient documentation

## 2018-09-01 DIAGNOSIS — N400 Enlarged prostate without lower urinary tract symptoms: Secondary | ICD-10-CM | POA: Diagnosis not present

## 2018-09-01 DIAGNOSIS — D333 Benign neoplasm of cranial nerves: Secondary | ICD-10-CM | POA: Insufficient documentation

## 2018-09-01 DIAGNOSIS — Z87891 Personal history of nicotine dependence: Secondary | ICD-10-CM | POA: Insufficient documentation

## 2018-09-01 DIAGNOSIS — G4700 Insomnia, unspecified: Secondary | ICD-10-CM | POA: Diagnosis not present

## 2018-09-01 DIAGNOSIS — Z86010 Personal history of colonic polyps: Secondary | ICD-10-CM | POA: Diagnosis not present

## 2018-09-01 DIAGNOSIS — H05222 Edema of left orbit: Secondary | ICD-10-CM | POA: Diagnosis not present

## 2018-09-01 DIAGNOSIS — R51 Headache: Secondary | ICD-10-CM | POA: Insufficient documentation

## 2018-09-01 DIAGNOSIS — H0520 Unspecified exophthalmos: Secondary | ICD-10-CM | POA: Diagnosis not present

## 2018-09-01 DIAGNOSIS — D320 Benign neoplasm of cerebral meninges: Secondary | ICD-10-CM | POA: Diagnosis not present

## 2018-09-01 HISTORY — DX: Benign neoplasm of cranial nerves: D33.3
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
## 2018-09-01 NOTE — Progress Notes (Signed)
Location/Histology of Brain Tumor: Meningioma left sphenoid wing involving the cavernous sinus.   Patient presented with symptoms of:  New onset left orbital swelling, diminishing vision in left eye and left frontal headaches  Past or anticipated interventions, if any, per neurosurgery: Resection of left orbital meningioma in 2010  Past or anticipated interventions, if any, per medical oncology: no  Dose of Decadron, if applicable: no  Recent neurologic symptoms, if any:   Seizures: no  Headaches: Yes, persistent left frontal headaches worse at night. Reports taking Tylenol to dull the pain. Reports headaches make it difficulty to fall asleep.  Nausea: no  Dizziness/ataxia: no  Difficulty with hand coordination: no  Focal numbness/weakness: no  Visual deficits/changes: Diminished vision right eye. Left eye bulge. Reports letter are missing when he reads. States, "it looks like black smudge has been smeared all over the lens in my glasses over my left eye and I am trying to look through it." Goes onto say, "I know something is there but I can't see it in detail.   Confusion/Memory deficits: no  Tinnitus: Occasionally but "has for years"  Painful bone metastases at present, if any: no  Reports pain in upper back related to effects of arthritis. Reports pain associated with persistent left front headache.  SAFETY ISSUES:  Prior radiation? no  Pacemaker/ICD? no  Possible current pregnancy? no  Is the patient on methotrexate? no  Additional Complaints / other details: 84 year old male. Lives with wife of 14 years who has dementia.  Has 3 sons.  Retired Administrator.  Education: 10th grade. Resides in [place]. Dr. Townsend Roger, PCP, referred patient to Dr. Vertell Limber for evaluation.

## 2018-09-01 NOTE — Progress Notes (Signed)
See progress note under physician encounter. 

## 2018-09-02 DIAGNOSIS — D329 Benign neoplasm of meninges, unspecified: Secondary | ICD-10-CM | POA: Insufficient documentation

## 2018-09-02 NOTE — Progress Notes (Signed)
Brain and Spine Tumor Board Documentation  Andrew Montgomery was presented by Cecil Cobbs, MD at Brain and Spine Tumor Board on 09/02/2018, which included representatives from neuro oncology, radiation oncology, surgical oncology, genetics, navigation, pathology, radiology.  [name] was presented as a current patient with history of the following treatments:  .  Additionally, we reviewed previous medical and familial history, history of present illness, and recent lab results along with all available histopathologic and imaging studies. The tumor board considered available treatment options and made the following recommendations:  Radiation therapy (primary modality)  Tumor board is a meeting of clinicians from various specialty areas who evaluate and discuss patients for whom a multidisciplinary approach is being considered. Final determinations in the plan of care are those of the provider(s). The responsibility for follow up of recommendations given during tumor board is that of the provider.   Today's extended care, comprehensive team conference, Andrew Montgomery was not present for the discussion and was not examined.

## 2018-09-03 ENCOUNTER — Encounter: Payer: Self-pay | Admitting: General Practice

## 2018-09-03 DIAGNOSIS — I10 Essential (primary) hypertension: Secondary | ICD-10-CM | POA: Diagnosis not present

## 2018-09-03 DIAGNOSIS — D320 Benign neoplasm of cerebral meninges: Secondary | ICD-10-CM | POA: Diagnosis not present

## 2018-09-03 DIAGNOSIS — Z6828 Body mass index (BMI) 28.0-28.9, adult: Secondary | ICD-10-CM | POA: Diagnosis not present

## 2018-09-03 NOTE — Progress Notes (Signed)
[place] Psychosocial Distress Screening Clinical Social Work  Clinical Social Work was referred by distress screening protocol.  The patient scored a 6 on the Psychosocial Distress Thermometer which indicates moderate distress. Clinical Social Worker contacted patient by phone to assess for distress and other psychosocial needs. Unable to reach patient by phone, unable to leave VM.  Will attempt second call to review distress screen and offer resources as needed.    ONCBCN DISTRESS SCREENING 09/01/2018  Screening Type Initial Screening  Distress experienced in past week (1-10) 6  Family Problem type Partner  Emotional problem type Depression;Boredom  Information Concerns Type Lack of info about treatment  Physical Problem type Pain;Sleep/insomnia  Physician notified of physical symptoms Yes  Referral to clinical psychology No  Referral to clinical social work Yes  Referral to dietition No  Referral to financial advocate No  Referral to support programs No  Referral to palliative care No    Clinical Social Worker follow up needed: Yes.    If yes, follow up plan:  Recontact.  Beverely Pace, [place], LCSW Clinical Social Worker Phone:  [phone]

## 2018-09-04 ENCOUNTER — Telehealth: Payer: Self-pay | Admitting: General Practice

## 2018-09-04 NOTE — Telephone Encounter (Signed)
Andrew Montgomery Psychosocial Distress Screening Clinical Social Work  Clinical Social Work was referred by distress screening protocol.  The patient scored a 6 on the Psychosocial Distress Thermometer which indicates moderate distress. Clinical Social Worker contacted patient by phone to assess for distress and other psychosocial needs.   Briefly reviewed options for support at [facility].  Patient plans to pursue treatment closer to his home at [facility].  No concerns re transportation or help at home if needed, "if I need anything I can just call one of my boys and they will help."  Encouraged to access resources at [facility] or contact CSW if needed for resources in future.   ONCBCN DISTRESS SCREENING 09/01/2018  Screening Type Initial Screening  Distress experienced in past week (1-10) 6  Family Problem type Partner  Emotional problem type Depression;Boredom  Information Concerns Type Lack of info about treatment  Physical Problem type Pain;Sleep/insomnia  Physician notified of physical symptoms Yes  Referral to clinical psychology No  Referral to clinical social work Yes  Referral to dietition No  Referral to financial advocate No  Referral to support programs No  Referral to palliative care No    Clinical Social Worker follow up needed: No.  If yes, follow up plan:  Andrew Montgomery, [name], LCSW Clinical Social Worker Phone:  [phone]

## 2018-09-05 DIAGNOSIS — D329 Benign neoplasm of meninges, unspecified: Secondary | ICD-10-CM | POA: Diagnosis not present

## 2018-09-05 DIAGNOSIS — D333 Benign neoplasm of cranial nerves: Secondary | ICD-10-CM | POA: Diagnosis not present

## 2018-09-08 DIAGNOSIS — E538 Deficiency of other specified B group vitamins: Secondary | ICD-10-CM | POA: Diagnosis not present

## 2018-09-09 DIAGNOSIS — D333 Benign neoplasm of cranial nerves: Secondary | ICD-10-CM | POA: Diagnosis not present

## 2018-09-09 DIAGNOSIS — Z510 Encounter for antineoplastic radiation therapy: Secondary | ICD-10-CM | POA: Diagnosis not present

## 2018-09-09 DIAGNOSIS — D329 Benign neoplasm of meninges, unspecified: Secondary | ICD-10-CM | POA: Diagnosis not present

## 2018-09-16 DIAGNOSIS — Z510 Encounter for antineoplastic radiation therapy: Secondary | ICD-10-CM | POA: Diagnosis not present

## 2018-09-16 DIAGNOSIS — D333 Benign neoplasm of cranial nerves: Secondary | ICD-10-CM | POA: Diagnosis not present

## 2018-09-19 DIAGNOSIS — Z510 Encounter for antineoplastic radiation therapy: Secondary | ICD-10-CM | POA: Diagnosis not present

## 2018-09-19 DIAGNOSIS — D333 Benign neoplasm of cranial nerves: Secondary | ICD-10-CM | POA: Diagnosis not present

## 2018-09-23 DIAGNOSIS — Z510 Encounter for antineoplastic radiation therapy: Secondary | ICD-10-CM | POA: Diagnosis not present

## 2018-09-23 DIAGNOSIS — D333 Benign neoplasm of cranial nerves: Secondary | ICD-10-CM | POA: Diagnosis not present

## 2018-09-24 DIAGNOSIS — Z510 Encounter for antineoplastic radiation therapy: Secondary | ICD-10-CM | POA: Diagnosis not present

## 2018-09-24 DIAGNOSIS — D333 Benign neoplasm of cranial nerves: Secondary | ICD-10-CM | POA: Diagnosis not present

## 2018-09-25 DIAGNOSIS — Z510 Encounter for antineoplastic radiation therapy: Secondary | ICD-10-CM | POA: Diagnosis not present

## 2018-09-25 DIAGNOSIS — D333 Benign neoplasm of cranial nerves: Secondary | ICD-10-CM | POA: Diagnosis not present

## 2018-09-26 DIAGNOSIS — Z510 Encounter for antineoplastic radiation therapy: Secondary | ICD-10-CM | POA: Diagnosis not present

## 2018-09-26 DIAGNOSIS — D333 Benign neoplasm of cranial nerves: Secondary | ICD-10-CM | POA: Diagnosis not present

## 2018-09-29 DIAGNOSIS — Z510 Encounter for antineoplastic radiation therapy: Secondary | ICD-10-CM | POA: Diagnosis not present

## 2018-09-29 DIAGNOSIS — D333 Benign neoplasm of cranial nerves: Secondary | ICD-10-CM | POA: Diagnosis not present

## 2018-09-30 DIAGNOSIS — Z510 Encounter for antineoplastic radiation therapy: Secondary | ICD-10-CM | POA: Diagnosis not present

## 2018-09-30 DIAGNOSIS — D333 Benign neoplasm of cranial nerves: Secondary | ICD-10-CM | POA: Diagnosis not present

## 2018-10-01 DIAGNOSIS — Z510 Encounter for antineoplastic radiation therapy: Secondary | ICD-10-CM | POA: Diagnosis not present

## 2018-10-01 DIAGNOSIS — D333 Benign neoplasm of cranial nerves: Secondary | ICD-10-CM | POA: Diagnosis not present

## 2018-10-02 DIAGNOSIS — D333 Benign neoplasm of cranial nerves: Secondary | ICD-10-CM | POA: Diagnosis not present

## 2018-10-02 DIAGNOSIS — Z510 Encounter for antineoplastic radiation therapy: Secondary | ICD-10-CM | POA: Diagnosis not present

## 2018-10-03 DIAGNOSIS — D333 Benign neoplasm of cranial nerves: Secondary | ICD-10-CM | POA: Diagnosis not present

## 2018-10-03 DIAGNOSIS — Z510 Encounter for antineoplastic radiation therapy: Secondary | ICD-10-CM | POA: Diagnosis not present

## 2018-10-06 DIAGNOSIS — Z510 Encounter for antineoplastic radiation therapy: Secondary | ICD-10-CM | POA: Diagnosis not present

## 2018-10-06 DIAGNOSIS — D333 Benign neoplasm of cranial nerves: Secondary | ICD-10-CM | POA: Diagnosis not present

## 2018-10-07 DIAGNOSIS — D333 Benign neoplasm of cranial nerves: Secondary | ICD-10-CM | POA: Diagnosis not present

## 2018-10-07 DIAGNOSIS — Z510 Encounter for antineoplastic radiation therapy: Secondary | ICD-10-CM | POA: Diagnosis not present

## 2018-10-08 DIAGNOSIS — I10 Essential (primary) hypertension: Secondary | ICD-10-CM | POA: Diagnosis not present

## 2018-10-08 DIAGNOSIS — Z23 Encounter for immunization: Secondary | ICD-10-CM | POA: Diagnosis not present

## 2018-10-08 DIAGNOSIS — Z510 Encounter for antineoplastic radiation therapy: Secondary | ICD-10-CM | POA: Diagnosis not present

## 2018-10-08 DIAGNOSIS — I25708 Atherosclerosis of coronary artery bypass graft(s), unspecified, with other forms of angina pectoris: Secondary | ICD-10-CM | POA: Diagnosis not present

## 2018-10-08 DIAGNOSIS — D329 Benign neoplasm of meninges, unspecified: Secondary | ICD-10-CM | POA: Diagnosis not present

## 2018-10-08 DIAGNOSIS — E538 Deficiency of other specified B group vitamins: Secondary | ICD-10-CM | POA: Diagnosis not present

## 2018-10-08 DIAGNOSIS — D333 Benign neoplasm of cranial nerves: Secondary | ICD-10-CM | POA: Diagnosis not present

## 2018-10-09 DIAGNOSIS — D333 Benign neoplasm of cranial nerves: Secondary | ICD-10-CM | POA: Diagnosis not present

## 2018-10-09 DIAGNOSIS — Z510 Encounter for antineoplastic radiation therapy: Secondary | ICD-10-CM | POA: Diagnosis not present

## 2018-10-10 DIAGNOSIS — Z510 Encounter for antineoplastic radiation therapy: Secondary | ICD-10-CM | POA: Diagnosis not present

## 2018-10-10 DIAGNOSIS — D333 Benign neoplasm of cranial nerves: Secondary | ICD-10-CM | POA: Diagnosis not present

## 2018-10-13 DIAGNOSIS — Z510 Encounter for antineoplastic radiation therapy: Secondary | ICD-10-CM | POA: Diagnosis not present

## 2018-10-13 DIAGNOSIS — D333 Benign neoplasm of cranial nerves: Secondary | ICD-10-CM | POA: Diagnosis not present

## 2018-10-14 DIAGNOSIS — Z510 Encounter for antineoplastic radiation therapy: Secondary | ICD-10-CM | POA: Diagnosis not present

## 2018-10-14 DIAGNOSIS — D333 Benign neoplasm of cranial nerves: Secondary | ICD-10-CM | POA: Diagnosis not present

## 2018-10-16 DIAGNOSIS — D333 Benign neoplasm of cranial nerves: Secondary | ICD-10-CM | POA: Diagnosis not present

## 2018-10-16 DIAGNOSIS — Z510 Encounter for antineoplastic radiation therapy: Secondary | ICD-10-CM | POA: Diagnosis not present

## 2018-10-17 DIAGNOSIS — Z510 Encounter for antineoplastic radiation therapy: Secondary | ICD-10-CM | POA: Diagnosis not present

## 2018-10-17 DIAGNOSIS — D333 Benign neoplasm of cranial nerves: Secondary | ICD-10-CM | POA: Diagnosis not present

## 2018-10-20 DIAGNOSIS — Z510 Encounter for antineoplastic radiation therapy: Secondary | ICD-10-CM | POA: Diagnosis not present

## 2018-10-20 DIAGNOSIS — D333 Benign neoplasm of cranial nerves: Secondary | ICD-10-CM | POA: Diagnosis not present

## 2018-10-21 DIAGNOSIS — D333 Benign neoplasm of cranial nerves: Secondary | ICD-10-CM | POA: Diagnosis not present

## 2018-10-21 DIAGNOSIS — Z510 Encounter for antineoplastic radiation therapy: Secondary | ICD-10-CM | POA: Diagnosis not present

## 2018-10-22 DIAGNOSIS — D333 Benign neoplasm of cranial nerves: Secondary | ICD-10-CM | POA: Diagnosis not present

## 2018-10-22 DIAGNOSIS — Z510 Encounter for antineoplastic radiation therapy: Secondary | ICD-10-CM | POA: Diagnosis not present

## 2018-10-23 DIAGNOSIS — Z510 Encounter for antineoplastic radiation therapy: Secondary | ICD-10-CM | POA: Diagnosis not present

## 2018-10-23 DIAGNOSIS — D333 Benign neoplasm of cranial nerves: Secondary | ICD-10-CM | POA: Diagnosis not present

## 2018-10-24 DIAGNOSIS — Z510 Encounter for antineoplastic radiation therapy: Secondary | ICD-10-CM | POA: Diagnosis not present

## 2018-10-24 DIAGNOSIS — D333 Benign neoplasm of cranial nerves: Secondary | ICD-10-CM | POA: Diagnosis not present

## 2018-10-27 DIAGNOSIS — D333 Benign neoplasm of cranial nerves: Secondary | ICD-10-CM | POA: Diagnosis not present

## 2018-10-27 DIAGNOSIS — Z510 Encounter for antineoplastic radiation therapy: Secondary | ICD-10-CM | POA: Diagnosis not present

## 2018-10-28 DIAGNOSIS — Z510 Encounter for antineoplastic radiation therapy: Secondary | ICD-10-CM | POA: Diagnosis not present

## 2018-10-28 DIAGNOSIS — D333 Benign neoplasm of cranial nerves: Secondary | ICD-10-CM | POA: Diagnosis not present

## 2018-10-29 DIAGNOSIS — D333 Benign neoplasm of cranial nerves: Secondary | ICD-10-CM | POA: Diagnosis not present

## 2018-10-29 DIAGNOSIS — Z510 Encounter for antineoplastic radiation therapy: Secondary | ICD-10-CM | POA: Diagnosis not present

## 2018-10-30 DIAGNOSIS — D333 Benign neoplasm of cranial nerves: Secondary | ICD-10-CM | POA: Diagnosis not present

## 2018-10-30 DIAGNOSIS — Z510 Encounter for antineoplastic radiation therapy: Secondary | ICD-10-CM | POA: Diagnosis not present

## 2018-10-31 DIAGNOSIS — D333 Benign neoplasm of cranial nerves: Secondary | ICD-10-CM | POA: Diagnosis not present

## 2018-10-31 DIAGNOSIS — Z510 Encounter for antineoplastic radiation therapy: Secondary | ICD-10-CM | POA: Diagnosis not present

## 2018-11-03 DIAGNOSIS — Z510 Encounter for antineoplastic radiation therapy: Secondary | ICD-10-CM | POA: Diagnosis not present

## 2018-11-03 DIAGNOSIS — D333 Benign neoplasm of cranial nerves: Secondary | ICD-10-CM | POA: Diagnosis not present

## 2018-11-03 IMAGING — MR MR ORBITS WO/W CM
14 of 17 series · 38 of 48 positions shown · IV contrast (19 ml multihance)
Comparison: MRI head 06/02/2018 04/01/2009

CLINICAL DATA: Left sphenoid wing meningioma. New onset of left
orbital swelling. Meningioma left sphenoid wing involving the
cavernous sinus.

EXAM:
MRI OF THE ORBITS WITHOUT AND WITH CONTRAST
TECHNIQUE: Multiplanar, multisequence MR imaging of the orbits was performed
both before and after the administration of intravenous contrast.
CONTRAST:  20mL MULTIHANCE GADOBENATE DIMEGLUMINE 529 MG/ML IV SOLN

[Series 3: FLAIR · sagittal · 3.0mm · 0.78mm/px · 1 of 48 slices shown (1 of 2)]
[im 1/48]
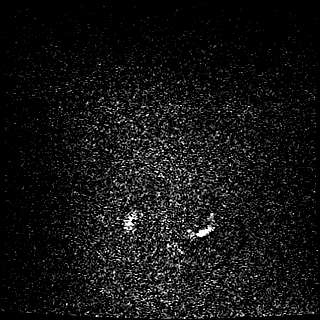

[Series 4: DWI · axial · 3.0mm · 1.56mm/px · z∈[-144,+36]mm · 4 of 94 slices shown (1 of 2)]
[im 1/94]
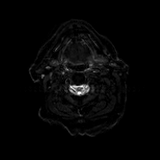
[im 32/94]
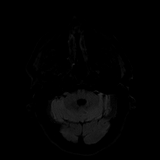
[im 63/94]
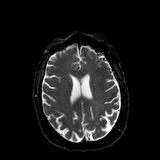
[im 94/94]
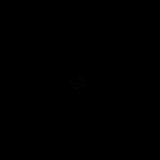

[Series 5: DWI · axial · 3.0mm · 1.56mm/px · z∈[-144,+36]mm · 2 of 45 slices shown (2 of 2)]
[im 1/45]
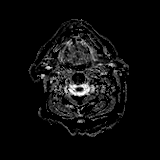
[im 45/45]
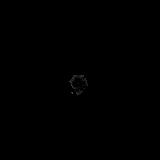

[Series 6: T2 · axial · 5.0mm · 0.65mm/px · z∈[-137,+49]mm · 2 of 32 slices shown]
[im 1/32]
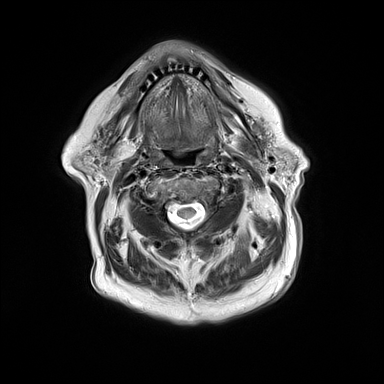
[im 32/32]
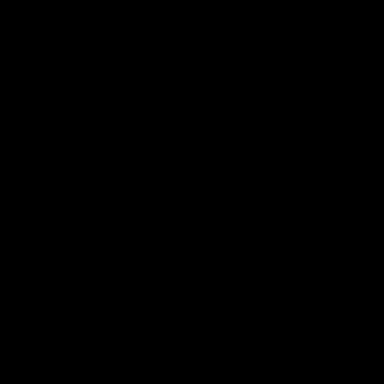

[Series 7: GRE · axial · 5.0mm · 0.57mm/px · z∈[-137,+49]mm · 2 of 32 slices shown]
[im 1/32]
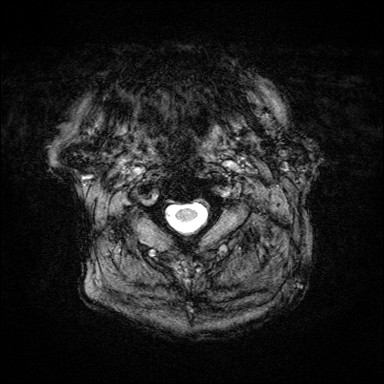
[im 32/32]
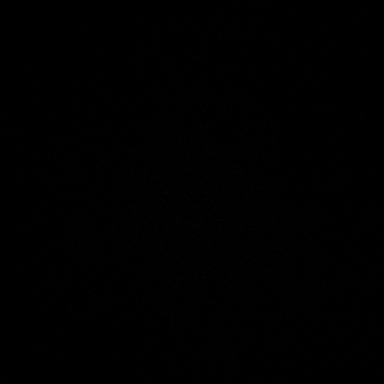

[Series 8: FLAIR · axial · 3.0mm · 0.62mm/px · z∈[-132,+39]mm · 3 of 58 slices shown (2 of 2)]
[im 1/58]
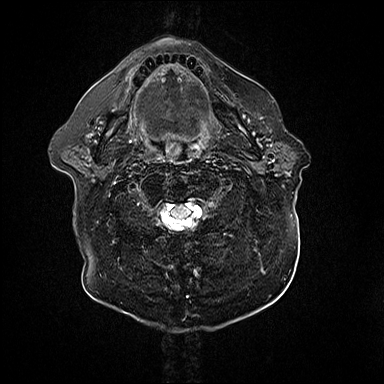
[im 29/58]
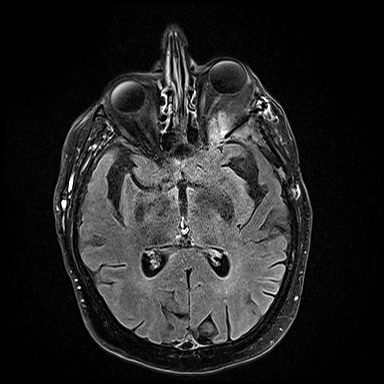
[im 58/58]
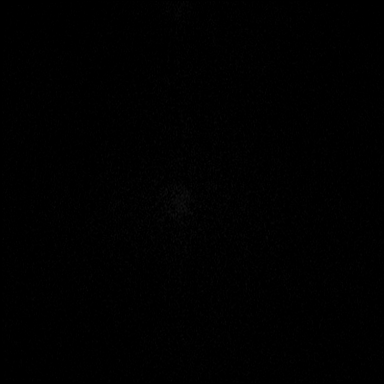

[Series 9: T1 · axial · 1.0mm · 0.78mm/px · z∈[-122,-54]mm · 4 of 160 slices shown]
[im 1/160]
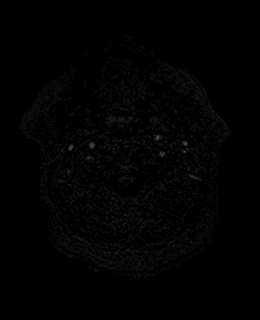
[im 23/160]
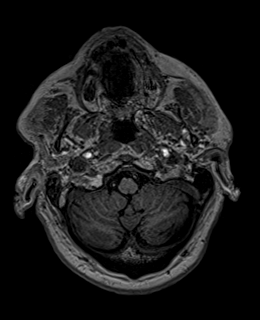
[im 46/160]
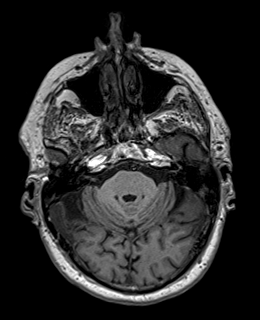
[im 69/160]
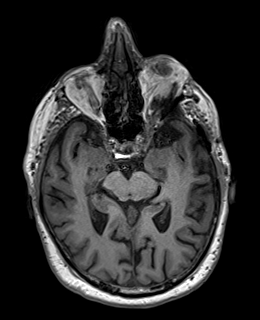

[Series 10: T2 fat-sat · axial · 2.5mm · 0.25mm/px · z∈[-100,-6]mm · 2 of 35 slices shown (1 of 2)]
[im 1/35]
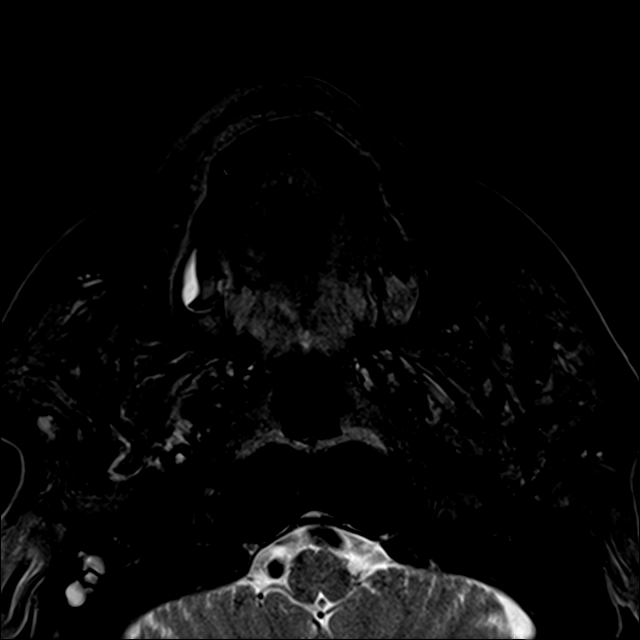
[im 35/35]
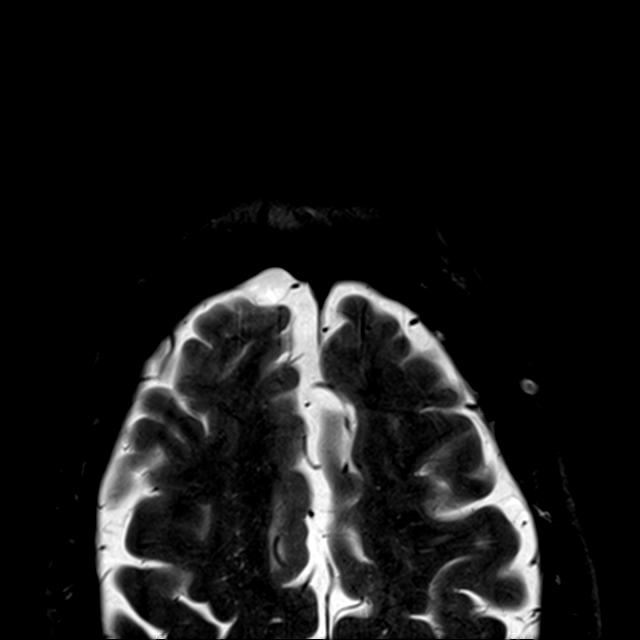

[Series 11: T2 fat-sat · coronal · 2.5mm · 0.25mm/px · 2 of 45 slices shown (2 of 2)]
[im 1/45]
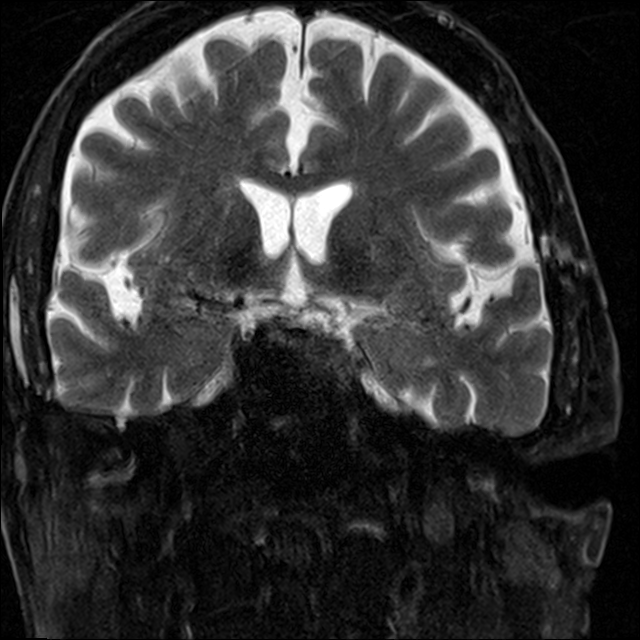
[im 45/45]
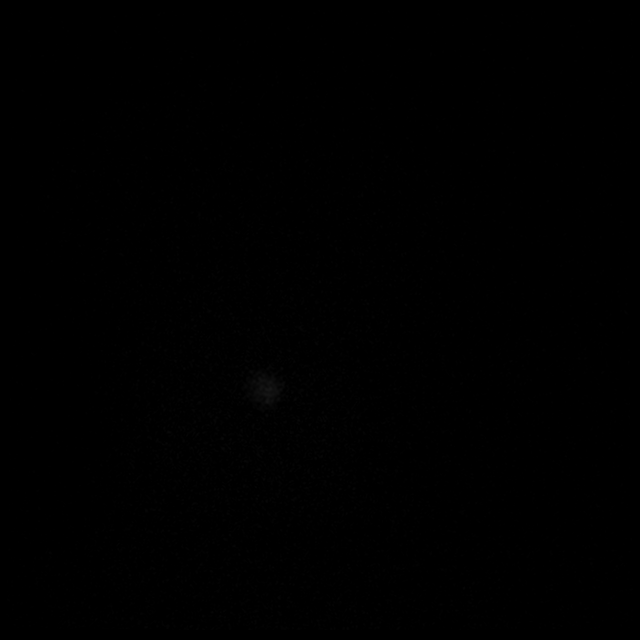

[Series 14: T2 post-contrast · coronal · 3.0mm · 0.57mm/px · 2 of 46 slices shown]
[im 1/46]
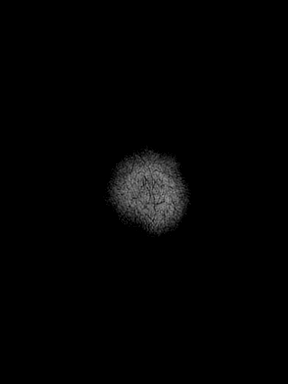
[im 46/46]
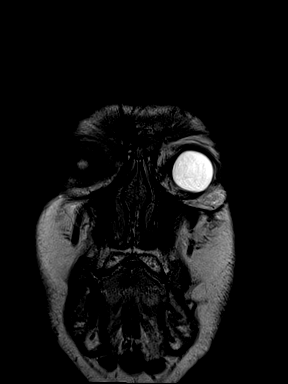

[Series 16: T1 post-contrast · coronal · 2.5mm · 0.50mm/px · 2 of 45 slices shown (1 of 3)]
[im 1/45]
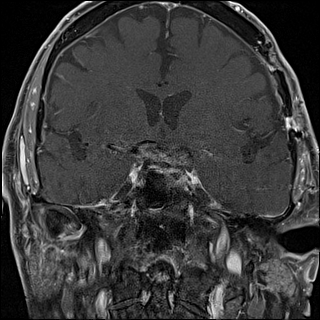
[im 45/45]
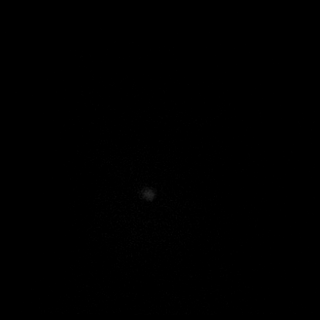

[Series 17: T1 post-contrast · axial · 1.0mm · 0.78mm/px · z∈[-122,+37]mm · 8 of 160 slices shown (2 of 3)]
[im 1/160]
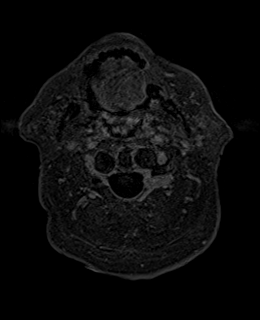
[im 23/160]
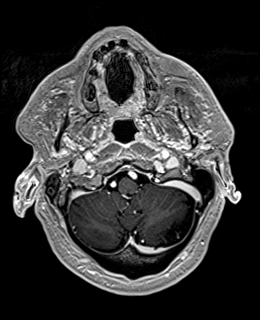
[im 46/160]
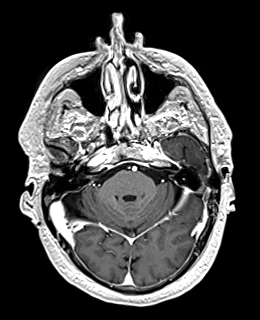
[im 69/160]
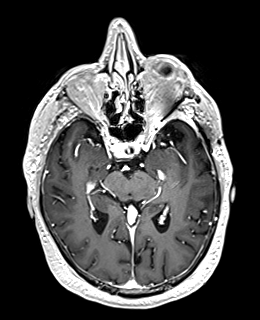
[im 91/160]
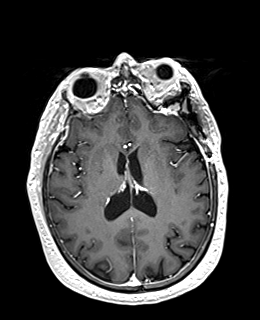
[im 114/160]
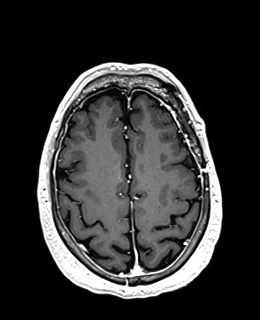
[im 137/160]
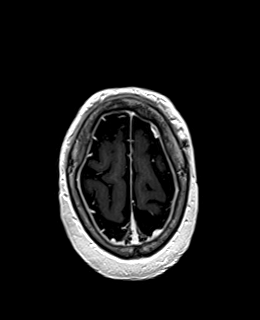
[im 160/160]
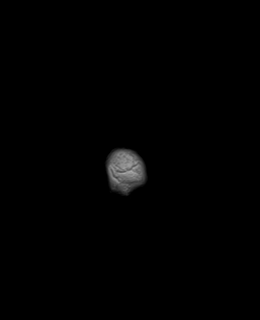

[Series 18: T1 post-contrast · coronal · 3.0mm · 0.57mm/px · 2 of 47 slices shown (3 of 3)]
[im 1/47]
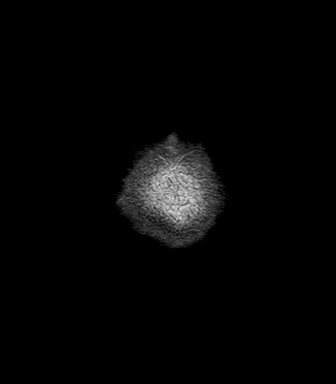
[im 47/47]
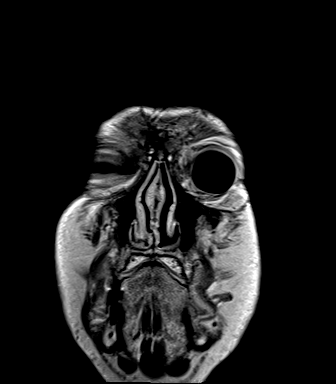

[Series 19: FLAIR post-contrast · sagittal · 3.0mm · 0.78mm/px · 2 of 48 slices shown]
[im 1/48]
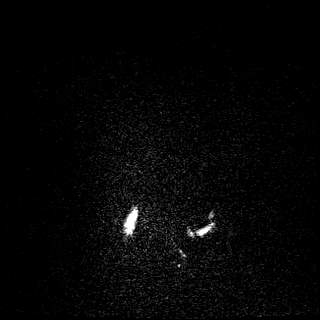
[im 48/48]
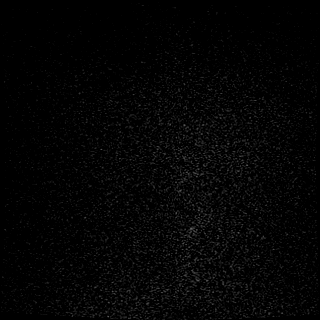

[38 of 48 positions shown; findings below may reference images not displayed]

FINDINGS: Tumor is again noted along the left sphenoid wing. This extends into
the posterolateral aspect of the left orbit enhancing component
measures 3.5 x 1.4 x 2.5 cm. Left optic nerve is displaced medially.
The lateral rectus muscle is displaced inferiorly. Unilateral
left-sided exophthalmos is present. The globe is normal. The right
orbit is within normal limits.

Left tearing which item E is present. The portion of the tumor along
the sphenoid wing has been resected.

Optic nerve appears separate from the lesion.

There is enhancement extending into the cavernous sinus with some
expansion of the cavernous sinus.

Conventional imaging brain is otherwise stable. Mild atrophy and
white matter changes are again noted.

Acute infarct or hemorrhage is present. Ventricles are proportionate
to the degree of atrophy. No significant extra-axial fluid
collection is present. The paranasal sinuses are clear. There is
some fluid in the right mastoid air cells.
IMPRESSION: 1. Enhancing tumor extending through the left orbital apex with
involvement of the left cavernous sinus and lateral posterior left
orbit compatible with residual tumor from a meningioma previously
resected.
2. Mass effect on the left orbit and globe results in asymmetric
left-sided exophthalmos.
3. There is no significant interval change in size.
4. MRI of the brain is otherwise normal for age.

## 2018-11-10 DIAGNOSIS — K5900 Constipation, unspecified: Secondary | ICD-10-CM | POA: Diagnosis not present

## 2018-11-10 DIAGNOSIS — E538 Deficiency of other specified B group vitamins: Secondary | ICD-10-CM | POA: Diagnosis not present

## 2018-11-17 DIAGNOSIS — D333 Benign neoplasm of cranial nerves: Secondary | ICD-10-CM | POA: Diagnosis not present

## 2018-11-17 DIAGNOSIS — Z510 Encounter for antineoplastic radiation therapy: Secondary | ICD-10-CM | POA: Diagnosis not present

## 2018-12-04 DIAGNOSIS — D333 Benign neoplasm of cranial nerves: Secondary | ICD-10-CM | POA: Diagnosis not present

## 2018-12-12 DIAGNOSIS — E538 Deficiency of other specified B group vitamins: Secondary | ICD-10-CM | POA: Diagnosis not present

## 2019-01-12 DIAGNOSIS — E538 Deficiency of other specified B group vitamins: Secondary | ICD-10-CM | POA: Diagnosis not present

## 2019-01-23 DIAGNOSIS — M150 Primary generalized (osteo)arthritis: Secondary | ICD-10-CM | POA: Diagnosis not present

## 2019-01-23 DIAGNOSIS — M810 Age-related osteoporosis without current pathological fracture: Secondary | ICD-10-CM | POA: Diagnosis not present

## 2019-01-23 DIAGNOSIS — Z1389 Encounter for screening for other disorder: Secondary | ICD-10-CM | POA: Diagnosis not present

## 2019-01-23 DIAGNOSIS — Z0000 Encounter for general adult medical examination without abnormal findings: Secondary | ICD-10-CM | POA: Diagnosis not present

## 2019-01-23 DIAGNOSIS — Z6828 Body mass index (BMI) 28.0-28.9, adult: Secondary | ICD-10-CM | POA: Diagnosis not present

## 2019-01-23 DIAGNOSIS — I10 Essential (primary) hypertension: Secondary | ICD-10-CM | POA: Diagnosis not present

## 2019-01-23 DIAGNOSIS — D519 Vitamin B12 deficiency anemia, unspecified: Secondary | ICD-10-CM | POA: Diagnosis not present

## 2019-01-23 DIAGNOSIS — M4850XA Collapsed vertebra, not elsewhere classified, site unspecified, initial encounter for fracture: Secondary | ICD-10-CM | POA: Diagnosis not present

## 2019-01-23 DIAGNOSIS — J302 Other seasonal allergic rhinitis: Secondary | ICD-10-CM | POA: Diagnosis not present

## 2019-01-27 DIAGNOSIS — D333 Benign neoplasm of cranial nerves: Secondary | ICD-10-CM | POA: Diagnosis not present

## 2019-01-30 DIAGNOSIS — D320 Benign neoplasm of cerebral meninges: Secondary | ICD-10-CM | POA: Diagnosis not present

## 2019-01-30 DIAGNOSIS — R51 Headache: Secondary | ICD-10-CM | POA: Diagnosis not present

## 2019-01-30 DIAGNOSIS — D329 Benign neoplasm of meninges, unspecified: Secondary | ICD-10-CM | POA: Diagnosis not present

## 2019-02-02 DIAGNOSIS — D333 Benign neoplasm of cranial nerves: Secondary | ICD-10-CM | POA: Diagnosis not present

## 2019-02-13 DIAGNOSIS — E538 Deficiency of other specified B group vitamins: Secondary | ICD-10-CM | POA: Diagnosis not present

## 2019-03-16 DIAGNOSIS — E538 Deficiency of other specified B group vitamins: Secondary | ICD-10-CM | POA: Diagnosis not present

## 2019-04-17 DIAGNOSIS — E538 Deficiency of other specified B group vitamins: Secondary | ICD-10-CM | POA: Diagnosis not present

## 2019-05-20 DIAGNOSIS — E538 Deficiency of other specified B group vitamins: Secondary | ICD-10-CM | POA: Diagnosis not present

## 2019-06-22 DIAGNOSIS — E538 Deficiency of other specified B group vitamins: Secondary | ICD-10-CM | POA: Diagnosis not present

## 2019-07-08 DIAGNOSIS — D333 Benign neoplasm of cranial nerves: Secondary | ICD-10-CM | POA: Diagnosis not present

## 2019-07-22 DIAGNOSIS — E538 Deficiency of other specified B group vitamins: Secondary | ICD-10-CM | POA: Diagnosis not present

## 2019-08-24 DIAGNOSIS — E538 Deficiency of other specified B group vitamins: Secondary | ICD-10-CM | POA: Diagnosis not present

## 2019-09-23 DIAGNOSIS — Z23 Encounter for immunization: Secondary | ICD-10-CM | POA: Diagnosis not present

## 2019-09-23 DIAGNOSIS — E538 Deficiency of other specified B group vitamins: Secondary | ICD-10-CM | POA: Diagnosis not present

## 2019-09-28 DIAGNOSIS — H35372 Puckering of macula, left eye: Secondary | ICD-10-CM | POA: Diagnosis not present

## 2019-10-23 DIAGNOSIS — E538 Deficiency of other specified B group vitamins: Secondary | ICD-10-CM | POA: Diagnosis not present

## 2019-11-24 DIAGNOSIS — E538 Deficiency of other specified B group vitamins: Secondary | ICD-10-CM | POA: Diagnosis not present

## 2019-11-24 DIAGNOSIS — I10 Essential (primary) hypertension: Secondary | ICD-10-CM | POA: Diagnosis not present

## 2019-12-28 DIAGNOSIS — E538 Deficiency of other specified B group vitamins: Secondary | ICD-10-CM | POA: Diagnosis not present

## 2020-02-19 DIAGNOSIS — Z6828 Body mass index (BMI) 28.0-28.9, adult: Secondary | ICD-10-CM | POA: Diagnosis not present

## 2020-02-19 DIAGNOSIS — I10 Essential (primary) hypertension: Secondary | ICD-10-CM | POA: Diagnosis not present

## 2020-02-19 DIAGNOSIS — L989 Disorder of the skin and subcutaneous tissue, unspecified: Secondary | ICD-10-CM | POA: Diagnosis not present

## 2020-02-19 DIAGNOSIS — D329 Benign neoplasm of meninges, unspecified: Secondary | ICD-10-CM | POA: Diagnosis not present

## 2020-02-19 DIAGNOSIS — E538 Deficiency of other specified B group vitamins: Secondary | ICD-10-CM | POA: Diagnosis not present

## 2020-02-19 DIAGNOSIS — Z125 Encounter for screening for malignant neoplasm of prostate: Secondary | ICD-10-CM | POA: Diagnosis not present

## 2020-02-19 DIAGNOSIS — E559 Vitamin D deficiency, unspecified: Secondary | ICD-10-CM | POA: Diagnosis not present

## 2020-02-19 DIAGNOSIS — Z0000 Encounter for general adult medical examination without abnormal findings: Secondary | ICD-10-CM | POA: Diagnosis not present

## 2020-02-19 DIAGNOSIS — M810 Age-related osteoporosis without current pathological fracture: Secondary | ICD-10-CM | POA: Diagnosis not present

## 2020-02-19 DIAGNOSIS — G629 Polyneuropathy, unspecified: Secondary | ICD-10-CM | POA: Diagnosis not present

## 2020-02-19 DIAGNOSIS — Z1389 Encounter for screening for other disorder: Secondary | ICD-10-CM | POA: Diagnosis not present

## 2020-03-15 DIAGNOSIS — D485 Neoplasm of uncertain behavior of skin: Secondary | ICD-10-CM | POA: Diagnosis not present

## 2020-03-18 DIAGNOSIS — E538 Deficiency of other specified B group vitamins: Secondary | ICD-10-CM | POA: Diagnosis not present

## 2020-04-18 DIAGNOSIS — E538 Deficiency of other specified B group vitamins: Secondary | ICD-10-CM | POA: Diagnosis not present

## 2020-05-18 DIAGNOSIS — I10 Essential (primary) hypertension: Secondary | ICD-10-CM | POA: Diagnosis not present

## 2020-05-18 DIAGNOSIS — G629 Polyneuropathy, unspecified: Secondary | ICD-10-CM | POA: Diagnosis not present

## 2020-05-18 DIAGNOSIS — E538 Deficiency of other specified B group vitamins: Secondary | ICD-10-CM | POA: Diagnosis not present

## 2020-06-20 DIAGNOSIS — E538 Deficiency of other specified B group vitamins: Secondary | ICD-10-CM | POA: Diagnosis not present

## 2020-07-07 DIAGNOSIS — L578 Other skin changes due to chronic exposure to nonionizing radiation: Secondary | ICD-10-CM | POA: Diagnosis not present

## 2020-07-07 DIAGNOSIS — L570 Actinic keratosis: Secondary | ICD-10-CM | POA: Diagnosis not present

## 2020-07-07 DIAGNOSIS — L821 Other seborrheic keratosis: Secondary | ICD-10-CM | POA: Diagnosis not present

## 2020-07-21 DIAGNOSIS — H5202 Hypermetropia, left eye: Secondary | ICD-10-CM | POA: Diagnosis not present

## 2020-07-21 DIAGNOSIS — E538 Deficiency of other specified B group vitamins: Secondary | ICD-10-CM | POA: Diagnosis not present

## 2020-07-21 DIAGNOSIS — H5203 Hypermetropia, bilateral: Secondary | ICD-10-CM | POA: Diagnosis not present

## 2020-07-21 DIAGNOSIS — H5213 Myopia, bilateral: Secondary | ICD-10-CM | POA: Diagnosis not present

## 2020-07-21 DIAGNOSIS — H52209 Unspecified astigmatism, unspecified eye: Secondary | ICD-10-CM | POA: Diagnosis not present

## 2020-07-21 DIAGNOSIS — H524 Presbyopia: Secondary | ICD-10-CM | POA: Diagnosis not present

## 2020-08-18 DIAGNOSIS — I10 Essential (primary) hypertension: Secondary | ICD-10-CM | POA: Diagnosis not present

## 2020-08-23 DIAGNOSIS — E538 Deficiency of other specified B group vitamins: Secondary | ICD-10-CM | POA: Diagnosis not present

## 2020-09-23 DIAGNOSIS — E538 Deficiency of other specified B group vitamins: Secondary | ICD-10-CM | POA: Diagnosis not present

## 2020-10-24 DIAGNOSIS — E538 Deficiency of other specified B group vitamins: Secondary | ICD-10-CM | POA: Diagnosis not present

## 2020-10-24 DIAGNOSIS — Z23 Encounter for immunization: Secondary | ICD-10-CM | POA: Diagnosis not present

## 2020-11-22 DIAGNOSIS — I10 Essential (primary) hypertension: Secondary | ICD-10-CM | POA: Diagnosis not present

## 2020-11-22 DIAGNOSIS — E538 Deficiency of other specified B group vitamins: Secondary | ICD-10-CM | POA: Diagnosis not present

## 2020-12-26 DIAGNOSIS — E538 Deficiency of other specified B group vitamins: Secondary | ICD-10-CM | POA: Diagnosis not present

## 2021-02-14 DIAGNOSIS — E538 Deficiency of other specified B group vitamins: Secondary | ICD-10-CM | POA: Diagnosis not present

## 2021-02-20 DIAGNOSIS — Z1331 Encounter for screening for depression: Secondary | ICD-10-CM | POA: Diagnosis not present

## 2021-02-20 DIAGNOSIS — Z0000 Encounter for general adult medical examination without abnormal findings: Secondary | ICD-10-CM | POA: Diagnosis not present

## 2021-02-20 DIAGNOSIS — E559 Vitamin D deficiency, unspecified: Secondary | ICD-10-CM | POA: Diagnosis not present

## 2021-02-20 DIAGNOSIS — G629 Polyneuropathy, unspecified: Secondary | ICD-10-CM | POA: Diagnosis not present

## 2021-02-20 DIAGNOSIS — Z6828 Body mass index (BMI) 28.0-28.9, adult: Secondary | ICD-10-CM | POA: Diagnosis not present

## 2021-02-20 DIAGNOSIS — E538 Deficiency of other specified B group vitamins: Secondary | ICD-10-CM | POA: Diagnosis not present

## 2021-02-20 DIAGNOSIS — Z1339 Encounter for screening examination for other mental health and behavioral disorders: Secondary | ICD-10-CM | POA: Diagnosis not present

## 2021-02-20 DIAGNOSIS — I10 Essential (primary) hypertension: Secondary | ICD-10-CM | POA: Diagnosis not present

## 2021-02-20 DIAGNOSIS — M810 Age-related osteoporosis without current pathological fracture: Secondary | ICD-10-CM | POA: Diagnosis not present

## 2021-02-20 DIAGNOSIS — M159 Polyosteoarthritis, unspecified: Secondary | ICD-10-CM | POA: Diagnosis not present

## 2021-02-22 DIAGNOSIS — M8589 Other specified disorders of bone density and structure, multiple sites: Secondary | ICD-10-CM | POA: Diagnosis not present

## 2021-02-22 DIAGNOSIS — M810 Age-related osteoporosis without current pathological fracture: Secondary | ICD-10-CM | POA: Diagnosis not present

## 2021-02-28 DIAGNOSIS — Z1211 Encounter for screening for malignant neoplasm of colon: Secondary | ICD-10-CM | POA: Diagnosis not present

## 2021-02-28 DIAGNOSIS — Z1212 Encounter for screening for malignant neoplasm of rectum: Secondary | ICD-10-CM | POA: Diagnosis not present

## 2021-03-15 DIAGNOSIS — E538 Deficiency of other specified B group vitamins: Secondary | ICD-10-CM | POA: Diagnosis not present

## 2021-03-23 DIAGNOSIS — I10 Essential (primary) hypertension: Secondary | ICD-10-CM | POA: Diagnosis not present

## 2021-03-23 DIAGNOSIS — M810 Age-related osteoporosis without current pathological fracture: Secondary | ICD-10-CM | POA: Diagnosis not present

## 2021-04-17 DIAGNOSIS — E538 Deficiency of other specified B group vitamins: Secondary | ICD-10-CM | POA: Diagnosis not present

## 2021-04-24 DIAGNOSIS — I10 Essential (primary) hypertension: Secondary | ICD-10-CM | POA: Diagnosis not present

## 2021-05-18 DIAGNOSIS — E538 Deficiency of other specified B group vitamins: Secondary | ICD-10-CM | POA: Diagnosis not present

## 2021-06-20 DIAGNOSIS — E538 Deficiency of other specified B group vitamins: Secondary | ICD-10-CM | POA: Diagnosis not present

## 2021-07-21 DIAGNOSIS — E538 Deficiency of other specified B group vitamins: Secondary | ICD-10-CM | POA: Diagnosis not present

## 2021-07-27 DIAGNOSIS — I10 Essential (primary) hypertension: Secondary | ICD-10-CM | POA: Diagnosis not present

## 2021-08-22 DIAGNOSIS — E538 Deficiency of other specified B group vitamins: Secondary | ICD-10-CM | POA: Diagnosis not present

## 2021-09-25 DIAGNOSIS — E538 Deficiency of other specified B group vitamins: Secondary | ICD-10-CM | POA: Diagnosis not present

## 2021-10-27 DIAGNOSIS — E538 Deficiency of other specified B group vitamins: Secondary | ICD-10-CM | POA: Diagnosis not present

## 2021-11-01 DIAGNOSIS — I10 Essential (primary) hypertension: Secondary | ICD-10-CM | POA: Diagnosis not present

## 2021-11-01 DIAGNOSIS — Z23 Encounter for immunization: Secondary | ICD-10-CM | POA: Diagnosis not present

## 2021-11-27 DIAGNOSIS — E538 Deficiency of other specified B group vitamins: Secondary | ICD-10-CM | POA: Diagnosis not present

## 2021-12-28 DIAGNOSIS — E538 Deficiency of other specified B group vitamins: Secondary | ICD-10-CM | POA: Diagnosis not present

## 2022-01-29 DIAGNOSIS — E538 Deficiency of other specified B group vitamins: Secondary | ICD-10-CM | POA: Diagnosis not present

## 2022-02-08 DIAGNOSIS — M810 Age-related osteoporosis without current pathological fracture: Secondary | ICD-10-CM | POA: Diagnosis not present

## 2022-02-08 DIAGNOSIS — I10 Essential (primary) hypertension: Secondary | ICD-10-CM | POA: Diagnosis not present

## 2022-02-08 DIAGNOSIS — Z0000 Encounter for general adult medical examination without abnormal findings: Secondary | ICD-10-CM | POA: Diagnosis not present

## 2022-02-08 DIAGNOSIS — M159 Polyosteoarthritis, unspecified: Secondary | ICD-10-CM | POA: Diagnosis not present

## 2022-02-08 DIAGNOSIS — M4802 Spinal stenosis, cervical region: Secondary | ICD-10-CM | POA: Diagnosis not present

## 2022-02-08 DIAGNOSIS — E538 Deficiency of other specified B group vitamins: Secondary | ICD-10-CM | POA: Diagnosis not present

## 2022-02-08 DIAGNOSIS — E559 Vitamin D deficiency, unspecified: Secondary | ICD-10-CM | POA: Diagnosis not present

## 2022-02-08 DIAGNOSIS — G629 Polyneuropathy, unspecified: Secondary | ICD-10-CM | POA: Diagnosis not present

## 2022-02-08 DIAGNOSIS — D329 Benign neoplasm of meninges, unspecified: Secondary | ICD-10-CM | POA: Diagnosis not present

## 2022-02-27 DIAGNOSIS — E538 Deficiency of other specified B group vitamins: Secondary | ICD-10-CM | POA: Diagnosis not present

## 2022-04-02 DIAGNOSIS — E538 Deficiency of other specified B group vitamins: Secondary | ICD-10-CM | POA: Diagnosis not present

## 2022-05-03 DIAGNOSIS — E538 Deficiency of other specified B group vitamins: Secondary | ICD-10-CM | POA: Diagnosis not present

## 2022-05-09 DIAGNOSIS — Z86011 Personal history of benign neoplasm of the brain: Secondary | ICD-10-CM | POA: Diagnosis not present

## 2022-05-09 DIAGNOSIS — I10 Essential (primary) hypertension: Secondary | ICD-10-CM | POA: Diagnosis not present

## 2022-06-04 DIAGNOSIS — E538 Deficiency of other specified B group vitamins: Secondary | ICD-10-CM | POA: Diagnosis not present

## 2022-06-11 DIAGNOSIS — H524 Presbyopia: Secondary | ICD-10-CM | POA: Diagnosis not present

## 2022-06-11 DIAGNOSIS — H353122 Nonexudative age-related macular degeneration, left eye, intermediate dry stage: Secondary | ICD-10-CM | POA: Diagnosis not present

## 2022-06-12 ENCOUNTER — Encounter: Payer: Self-pay | Admitting: Physician Assistant

## 2022-06-27 ENCOUNTER — Ambulatory Visit: Payer: Medicare Other | Admitting: Physician Assistant

## 2022-06-27 ENCOUNTER — Encounter: Payer: Self-pay | Admitting: Physician Assistant

## 2022-06-27 VITALS — BP 124/61 | HR 74 | Resp 18 | Ht 69.0 in | Wt 188.0 lb

## 2022-06-27 DIAGNOSIS — D329 Benign neoplasm of meninges, unspecified: Secondary | ICD-10-CM

## 2022-06-27 DIAGNOSIS — H543 Unqualified visual loss, both eyes: Secondary | ICD-10-CM | POA: Diagnosis not present

## 2022-06-27 NOTE — Patient Instructions (Addendum)
MRI of the orbits to rule out abnormalities in the eyes  Follow up pending on the results of the MRI

## 2022-06-27 NOTE — Progress Notes (Signed)
NEUROLOGY FOLLOW UP OFFICE NOTE  Andrew Montgomery [id number]  Assessment/Plan:   Andrew Montgomery is a pleasant 88 year old left-handed male with a history of hypertension, and history of left sphenoid wing involving the cavernous sinus meningioma with resection in 2010, last seen at our office 5 years ago for worsening symptoms of RLS, which are now controlled.  Since that time, the patient had new onset of left orbital swelling, diminishing vision in the left eye and left frontal headaches.  MRI of the orbits on 08/30/2018 revealing enhancing tumor extending through the left orbital apex with involvement of the left cavernous sinus and lateral posterior left orbit compatible with residual tumor from the meningioma previously resected.  There was mass effect of the left orbit and globe resulting in asymmetric left-sided exophthalmos without significant interval change in size.  MRI of the brain was otherwise normal for age.  Surgical resection was not indicated since he was his wife's sole caretaker, therefore radiotherapy was recommended.  He began radiotherapy for 6 weeks, tolerating it well.  Since that time, he remained in his usual state of health.  However, he is now presented with similar symptoms on the right, concerning for recurrence of the disease.  Recommendations  Repeat MRI of the orbits with and without contrast Follow-up pending on the results  Subjective:   In today's visit, the patient reports that since 2019, he was doing fairly well, but about 6 months ago, he began to have symptoms on the right eye area.  He reports that if he sees "a painting in front of the wall, would be seen in one place, the same pain team would be seen slightly off "in another place ".  He reports letters are missing when he reads. he denies any pain or abnormalities on the left I.  He has not seen an ophthalmologist recently.  He denies any pain in the eye.  He has mild right above the eyebrow headaches,  worse in the evenings.  No confusion is reported.  He denies any nausea and vomiting.  He denies changes in his speech or hearing.  He denies any difficulty with hand coordination, numbness or tingling.    MRI brain wwo contrast 04/01/2009: 1.  2.5 x 3.3 cm dural based mass lesion centered at the left greater wing of the sphenoid and entering the left orbit.  This is most compatible with a meningioma.  A dural based metastasis is considered, in this case most likely prostate cancer.  The idea of a metastasis rather than meningioma is considered highly unlikely. 2.  No acute intracranial abnormality.   MRI orbits w and wo contrast 08/31/2018 Tumor is again noted along the left sphenoid wing. This extends into the posterolateral aspect of the left orbit enhancing component measures 3.5 x 1.4 x 2.5 cm. Left optic nerve is displaced medially.The lateral rectus muscle is displaced inferiorly. Unilateral left-sided exophthalmos is present. The globe is normal. The right orbit is within normal limits.There is no significant interval change in size.  MRI of the brain is otherwise normal for age.  PAST MEDICAL HISTORY: Past Medical History:  Diagnosis Date   ALLERGIC RHINITIS 01/05/2010   BENIGN PROSTATIC HYPERTROPHY 01/05/2010   BPH (benign prostatic hyperplasia)    COLONIC POLYPS, HX OF 01/05/2010   Depression 07/06/2013   DJD (degenerative joint disease)    GERD (gastroesophageal reflux disease)    HYPERTENSION 01/05/2010   Insomnia 07/03/2011   Meningioma determined by biopsy of optic nerve (  [facility])    PSA, INCREASED 01/05/2010    MEDICATIONS: Current Outpatient Medications on File Prior to Visit  Medication Sig Dispense Refill   alendronate (FOSAMAX) 70 MG tablet      amLODipine (NORVASC) 10 MG tablet Take 1 tablet (10 mg total) by mouth daily. 90 tablet 3   bisoprolol-hydrochlorothiazide (ZIAC) 2.5-6.25 MG per tablet Take 1 tablet by mouth daily. 90 tablet 3   diclofenac (VOLTAREN) 75 MG EC tablet  Take 1 tablet (75 mg total) by mouth 2 (two) times daily as needed. 60 tablet 5   gabapentin (NEURONTIN) 300 MG capsule      lisinopril (PRINIVIL,ZESTRIL) 20 MG tablet Take 1 tablet (20 mg total) by mouth daily. 90 tablet 3   No current facility-administered medications on file prior to visit.    ALLERGIES: No Known Allergies  FAMILY HISTORY: Family History  Problem Relation Age of Onset   Arthritis Mother    Hypertension Mother    Heart disease Mother    .   Objective:   General: No acute distress.  Patient appears well  groomed.   Head:  Normocephalic/atraumatic Eyes: Diminished vision in the right eye, left eye bulge.  Left exophthalmos. Neck: supple, no paraspinal tenderness, full range of motion Heart:  Regular rate and rhythm Lungs:  Clear to auscultation bilaterally Back: No paraspinal tenderness Neurological Exam: alert and oriented to person, place, and time. Attention span and concentration intact, recent and remote memory intact, fund of knowledge intact.  Speech fluent and not dysarthric, language intact.  CN II-XII intact. Bulk and tone normal, muscle strength 5/5 throughout.  Sensation to light touch, temperature and vibration intact.  Deep tendon reflexes 2+ throughout, toes downgoing.  Finger to nose and heel to shin testing intact.  Gait normal, Romberg negative.   Sharene Butters, PA-C / Metta Clines, MD  CC: Nona Dell, Corene Cornea, MD

## 2022-07-09 DIAGNOSIS — E538 Deficiency of other specified B group vitamins: Secondary | ICD-10-CM | POA: Diagnosis not present

## 2022-07-10 ENCOUNTER — Ambulatory Visit
Admission: RE | Admit: 2022-07-10 | Discharge: 2022-07-10 | Disposition: A | Discharge status: Home / Self Care | Payer: Medicare Other | Source: Ambulatory Visit | Attending: Physician Assistant | Admitting: Physician Assistant

## 2022-07-10 DIAGNOSIS — D329 Benign neoplasm of meninges, unspecified: Secondary | ICD-10-CM

## 2022-07-10 DIAGNOSIS — H543 Unqualified visual loss, both eyes: Secondary | ICD-10-CM

## 2022-07-10 MED ORDER — GADOBENATE DIMEGLUMINE 529 MG/ML IV SOLN
18.0000 mL | Freq: Once | INTRAVENOUS | Status: AC | PRN
  Administered 2022-07-10: 18 mL via INTRAVENOUS

## 2022-07-11 NOTE — Progress Notes (Signed)
Please inform patient that the MRI of the eyes is without any changes since 2019!  Thanks

## 2022-08-10 DIAGNOSIS — E538 Deficiency of other specified B group vitamins: Secondary | ICD-10-CM | POA: Diagnosis not present

## 2022-08-13 DIAGNOSIS — I10 Essential (primary) hypertension: Secondary | ICD-10-CM | POA: Diagnosis not present

## 2022-09-11 DIAGNOSIS — E538 Deficiency of other specified B group vitamins: Secondary | ICD-10-CM | POA: Diagnosis not present

## 2022-10-12 DIAGNOSIS — Z23 Encounter for immunization: Secondary | ICD-10-CM | POA: Diagnosis not present

## 2022-10-12 DIAGNOSIS — E538 Deficiency of other specified B group vitamins: Secondary | ICD-10-CM | POA: Diagnosis not present

## 2022-10-15 DIAGNOSIS — H6992 Unspecified Eustachian tube disorder, left ear: Secondary | ICD-10-CM | POA: Diagnosis not present

## 2022-10-15 DIAGNOSIS — J342 Deviated nasal septum: Secondary | ICD-10-CM | POA: Diagnosis not present

## 2022-10-15 DIAGNOSIS — H9072 Mixed conductive and sensorineural hearing loss, unilateral, left ear, with unrestricted hearing on the contralateral side: Secondary | ICD-10-CM | POA: Diagnosis not present

## 2022-10-15 DIAGNOSIS — H9193 Unspecified hearing loss, bilateral: Secondary | ICD-10-CM | POA: Diagnosis not present

## 2022-10-15 DIAGNOSIS — H61303 Acquired stenosis of external ear canal, unspecified, bilateral: Secondary | ICD-10-CM | POA: Diagnosis not present

## 2022-11-05 DIAGNOSIS — H61303 Acquired stenosis of external ear canal, unspecified, bilateral: Secondary | ICD-10-CM | POA: Diagnosis not present

## 2022-11-05 DIAGNOSIS — H6992 Unspecified Eustachian tube disorder, left ear: Secondary | ICD-10-CM | POA: Diagnosis not present

## 2022-11-05 DIAGNOSIS — H9193 Unspecified hearing loss, bilateral: Secondary | ICD-10-CM | POA: Diagnosis not present

## 2022-11-13 ENCOUNTER — Ambulatory Visit (INDEPENDENT_AMBULATORY_CARE_PROVIDER_SITE_OTHER): Payer: Medicare Other

## 2022-11-13 DIAGNOSIS — E538 Deficiency of other specified B group vitamins: Secondary | ICD-10-CM | POA: Diagnosis not present

## 2022-11-13 MED ORDER — CYANOCOBALAMIN 1000 MCG/ML IJ SOLN
1000.0000 ug | Freq: Once | INTRAMUSCULAR | Status: DC
  Administered 2022-11-13: 1000 ug via INTRAMUSCULAR

## 2022-11-13 NOTE — Addendum Note (Signed)
Addended byRico Sheehan on: 11/13/2022 02:21 PM   Modules accepted: Orders

## 2022-11-14 MED ORDER — CYANOCOBALAMIN 1000 MCG/ML IJ SOLN
1000.0000 ug | Freq: Once | INTRAMUSCULAR | Status: AC
  Administered 2023-01-15: 1000 ug via INTRAMUSCULAR

## 2022-11-14 NOTE — Addendum Note (Signed)
Addended by: Isabell Jarvis on: 11/14/2022 09:15 AM   Modules accepted: Orders

## 2022-11-29 DIAGNOSIS — H35371 Puckering of macula, right eye: Secondary | ICD-10-CM | POA: Diagnosis not present

## 2022-11-29 DIAGNOSIS — H353132 Nonexudative age-related macular degeneration, bilateral, intermediate dry stage: Secondary | ICD-10-CM | POA: Diagnosis not present

## 2022-12-14 ENCOUNTER — Ambulatory Visit: Payer: Medicare Other

## 2022-12-14 DIAGNOSIS — E538 Deficiency of other specified B group vitamins: Secondary | ICD-10-CM | POA: Diagnosis not present

## 2022-12-14 MED ORDER — CYANOCOBALAMIN 1000 MCG/ML IJ SOLN
1000.0000 ug | Freq: Once | INTRAMUSCULAR | Status: AC
  Administered 2022-12-14: 1000 ug via INTRAMUSCULAR

## 2023-01-04 DIAGNOSIS — H9192 Unspecified hearing loss, left ear: Secondary | ICD-10-CM | POA: Diagnosis not present

## 2023-01-04 DIAGNOSIS — H6993 Unspecified Eustachian tube disorder, bilateral: Secondary | ICD-10-CM | POA: Diagnosis not present

## 2023-01-15 ENCOUNTER — Ambulatory Visit: Payer: Medicare Other

## 2023-01-15 DIAGNOSIS — E538 Deficiency of other specified B group vitamins: Secondary | ICD-10-CM | POA: Diagnosis not present

## 2023-01-17 ENCOUNTER — Other Ambulatory Visit: Payer: Self-pay

## 2023-01-17 MED ORDER — GABAPENTIN 600 MG PO TABS: 600.0000 mg | ORAL_TABLET | Freq: Two times a day (BID) | ORAL | 1 refills | Status: DC

## 2023-01-17 MED ORDER — OMEPRAZOLE 20 MG PO CPDR: 20.0000 mg | DELAYED_RELEASE_CAPSULE | Freq: Every day | ORAL | 1 refills | Status: DC

## 2023-01-17 MED ORDER — BISOPROLOL-HYDROCHLOROTHIAZIDE 2.5-6.25 MG PO TABS: 1.0000 | ORAL_TABLET | Freq: Every day | ORAL | 3 refills | Status: DC

## 2023-01-17 MED ORDER — LISINOPRIL 40 MG PO TABS: 40.0000 mg | ORAL_TABLET | Freq: Every day | ORAL | 1 refills | Status: DC

## 2023-01-17 MED ORDER — CHLORTHALIDONE 25 MG PO TABS: 25.0000 mg | ORAL_TABLET | Freq: Every day | ORAL | 1 refills | Status: DC

## 2023-01-17 MED ORDER — AMLODIPINE BESYLATE 10 MG PO TABS: 10.0000 mg | ORAL_TABLET | Freq: Every day | ORAL | 3 refills | Status: DC

## 2023-01-17 MED ORDER — DICLOFENAC SODIUM 75 MG PO TBEC: 75.0000 mg | DELAYED_RELEASE_TABLET | Freq: Two times a day (BID) | ORAL | 5 refills | Status: DC | PRN

## 2023-01-24 ENCOUNTER — Other Ambulatory Visit: Payer: Self-pay

## 2023-01-24 MED ORDER — BISOPROLOL FUMARATE 5 MG PO TABS: 5.0000 mg | ORAL_TABLET | Freq: Every day | ORAL | 1 refills | Status: DC

## 2023-02-12 ENCOUNTER — Other Ambulatory Visit: Payer: Self-pay | Admitting: Internal Medicine

## 2023-02-19 ENCOUNTER — Ambulatory Visit: Payer: Medicare Other

## 2023-02-19 DIAGNOSIS — E538 Deficiency of other specified B group vitamins: Secondary | ICD-10-CM

## 2023-02-19 MED ORDER — CYANOCOBALAMIN 1000 MCG/ML IJ SOLN
1000.0000 ug | Freq: Once | INTRAMUSCULAR | Status: AC
  Administered 2023-02-19: 1000 ug via INTRAMUSCULAR

## 2023-03-18 ENCOUNTER — Encounter: Payer: Self-pay | Admitting: Internal Medicine

## 2023-03-18 ENCOUNTER — Ambulatory Visit: Payer: Medicare Other | Admitting: Internal Medicine

## 2023-03-18 VITALS — BP 148/62 | HR 49 | Temp 97.6°F | Resp 16 | Ht 69.0 in | Wt 183.6 lb

## 2023-03-18 DIAGNOSIS — M159 Polyosteoarthritis, unspecified: Secondary | ICD-10-CM | POA: Diagnosis not present

## 2023-03-18 DIAGNOSIS — Z0000 Encounter for general adult medical examination without abnormal findings: Secondary | ICD-10-CM | POA: Diagnosis not present

## 2023-03-18 DIAGNOSIS — S32000D Wedge compression fracture of unspecified lumbar vertebra, subsequent encounter for fracture with routine healing: Secondary | ICD-10-CM | POA: Insufficient documentation

## 2023-03-18 DIAGNOSIS — G4700 Insomnia, unspecified: Secondary | ICD-10-CM | POA: Diagnosis not present

## 2023-03-18 DIAGNOSIS — J302 Other seasonal allergic rhinitis: Secondary | ICD-10-CM | POA: Diagnosis not present

## 2023-03-18 DIAGNOSIS — E559 Vitamin D deficiency, unspecified: Secondary | ICD-10-CM

## 2023-03-18 DIAGNOSIS — I10 Essential (primary) hypertension: Secondary | ICD-10-CM | POA: Diagnosis not present

## 2023-03-18 DIAGNOSIS — G629 Polyneuropathy, unspecified: Secondary | ICD-10-CM | POA: Insufficient documentation

## 2023-03-18 DIAGNOSIS — E538 Deficiency of other specified B group vitamins: Secondary | ICD-10-CM

## 2023-03-18 DIAGNOSIS — M8000XD Age-related osteoporosis with current pathological fracture, unspecified site, subsequent encounter for fracture with routine healing: Secondary | ICD-10-CM

## 2023-03-18 DIAGNOSIS — N400 Enlarged prostate without lower urinary tract symptoms: Secondary | ICD-10-CM

## 2023-03-18 DIAGNOSIS — M8000XA Age-related osteoporosis with current pathological fracture, unspecified site, initial encounter for fracture: Secondary | ICD-10-CM | POA: Insufficient documentation

## 2023-03-18 DIAGNOSIS — D329 Benign neoplasm of meninges, unspecified: Secondary | ICD-10-CM | POA: Diagnosis not present

## 2023-03-18 DIAGNOSIS — S32030D Wedge compression fracture of third lumbar vertebra, subsequent encounter for fracture with routine healing: Secondary | ICD-10-CM

## 2023-03-18 DIAGNOSIS — Z6827 Body mass index (BMI) 27.0-27.9, adult: Secondary | ICD-10-CM | POA: Insufficient documentation

## 2023-03-18 DIAGNOSIS — K219 Gastro-esophageal reflux disease without esophagitis: Secondary | ICD-10-CM

## 2023-03-18 NOTE — Assessment & Plan Note (Signed)
We will check a Vit B12 level today.

## 2023-03-18 NOTE — Assessment & Plan Note (Signed)
He stopped his fosamax.  I discussed performing another bone density and he refuses.  I will check his Vitamin D levels today.

## 2023-03-18 NOTE — Assessment & Plan Note (Signed)
He states he does not want any referrals to radiation oncology and he does not want a repeat MRI.  He understands that he could have worsening of his meningioma and symptoms but does not want anything done.

## 2023-03-18 NOTE — Assessment & Plan Note (Signed)
I am going to talk to the pain doctor about his arthritis and get back to the patient.

## 2023-03-18 NOTE — Assessment & Plan Note (Signed)
He will continue his PPI at this time.

## 2023-03-18 NOTE — Assessment & Plan Note (Signed)
This is not an issue and is controlled today.

## 2023-03-18 NOTE — Assessment & Plan Note (Signed)
We will check a Vitamin D level today.

## 2023-03-18 NOTE — Assessment & Plan Note (Signed)
I want him to eat healthy and to stay active.
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
## 2023-03-18 NOTE — Assessment & Plan Note (Signed)
Neurosurgery gave him this diagnosis where he remains on Vit B12 and gabapentin.  His neuropathy seems to be controlled.

## 2023-03-18 NOTE — Assessment & Plan Note (Signed)
He has been on fosamax for his osteoporosis.  He has no complaints of his back today.

## 2023-03-18 NOTE — Assessment & Plan Note (Signed)
His BP is elevated his BP looked good all last year.  We will see what his BP is doing on his next visit.

## 2023-03-18 NOTE — Assessment & Plan Note (Signed)
He denies any symptoms at this time.  He is not on any medicatons.

## 2023-03-19 LAB — CBC WITH DIFFERENTIAL/PLATELET
Basophils Absolute: 0 10*3/uL (ref 0.0–0.2)
Basos: 1 %
EOS (ABSOLUTE): 0.1 10*3/uL (ref 0.0–0.4)
Eos: 1 %
Hematocrit: 35.3 % — ABNORMAL LOW (ref 37.5–51.0)
Hemoglobin: 12.3 g/dL — ABNORMAL LOW (ref 13.0–17.7)
Immature Grans (Abs): 0 10*3/uL (ref 0.0–0.1)
Immature Granulocytes: 0 %
Lymphocytes Absolute: 1.6 10*3/uL (ref 0.7–3.1)
Lymphs: 29 %
MCH: 33.5 pg — ABNORMAL HIGH (ref 26.6–33.0)
MCHC: 34.8 g/dL (ref 31.5–35.7)
MCV: 96 fL (ref 79–97)
Monocytes Absolute: 0.5 10*3/uL (ref 0.1–0.9)
Monocytes: 10 %
Neutrophils Absolute: 3.3 10*3/uL (ref 1.4–7.0)
Neutrophils: 59 %
Platelets: 273 10*3/uL (ref 150–450)
RBC: 3.67 x10E6/uL — ABNORMAL LOW (ref 4.14–5.80)
RDW: 12.2 % (ref 11.6–15.4)
WBC: 5.5 10*3/uL (ref 3.4–10.8)

## 2023-03-19 LAB — CMP14 + ANION GAP
ALT: 23 IU/L (ref 0–44)
AST: 27 IU/L (ref 0–40)
Albumin/Globulin Ratio: 2.2 (ref 1.2–2.2)
Albumin: 4.4 g/dL (ref 3.7–4.7)
Alkaline Phosphatase: 59 IU/L (ref 44–121)
Anion Gap: 15 mmol/L (ref 10.0–18.0)
BUN/Creatinine Ratio: 18 (ref 10–24)
BUN: 27 mg/dL (ref 8–27)
Bilirubin Total: 0.4 mg/dL (ref 0.0–1.2)
CO2: 22 mmol/L (ref 20–29)
Calcium: 9.2 mg/dL (ref 8.6–10.2)
Chloride: 96 mmol/L (ref 96–106)
Creatinine, Ser: 1.49 mg/dL — ABNORMAL HIGH (ref 0.76–1.27)
Globulin, Total: 2 g/dL (ref 1.5–4.5)
Glucose: 89 mg/dL (ref 70–99)
Potassium: 5.2 mmol/L (ref 3.5–5.2)
Sodium: 133 mmol/L — ABNORMAL LOW (ref 134–144)
Total Protein: 6.4 g/dL (ref 6.0–8.5)
eGFR: 45 mL/min/{1.73_m2} — ABNORMAL LOW (ref >59)

## 2023-03-19 LAB — LIPID PANEL
Chol/HDL Ratio: 3.6 ratio (ref 0.0–5.0)
Cholesterol, Total: 188 mg/dL (ref 100–199)
HDL: 52 mg/dL (ref >39)
LDL Chol Calc (NIH): 123 mg/dL — ABNORMAL HIGH (ref 0–99)
Triglycerides: 70 mg/dL (ref 0–149)
VLDL Cholesterol Cal: 13 mg/dL (ref 5–40)

## 2023-03-19 LAB — VITAMIN B12: Vitamin B-12: 952 pg/mL (ref 232–1245)

## 2023-03-19 LAB — HEMOGLOBIN A1C
Est. average glucose Bld gHb Est-mCnc: 103 mg/dL
Hgb A1c MFr Bld: 5.2 % (ref 4.8–5.6)

## 2023-03-19 LAB — VITAMIN D 25 HYDROXY (VIT D DEFICIENCY, FRACTURES): Vit D, 25-Hydroxy: 53.6 ng/mL (ref 30.0–100.0)

## 2023-03-19 LAB — TSH: TSH: 2.38 u[IU]/mL (ref 0.450–4.500)

## 2023-03-19 LAB — PSA: Prostate Specific Ag, Serum: 3.2 ng/mL (ref 0.0–4.0)

## 2023-03-21 ENCOUNTER — Ambulatory Visit: Payer: Medicare Other | Admitting: Internal Medicine

## 2023-03-21 DIAGNOSIS — E538 Deficiency of other specified B group vitamins: Secondary | ICD-10-CM | POA: Diagnosis not present

## 2023-03-21 MED ORDER — CYANOCOBALAMIN 1000 MCG/ML IJ SOLN
1000.0000 ug | Freq: Once | INTRAMUSCULAR | Status: AC
  Administered 2023-03-21: 1000 ug via INTRAMUSCULAR

## 2023-03-21 NOTE — Progress Notes (Signed)
Nurse Visit    B12 Injection   Lot# [id number] Exp: 08/2024   Site:[facility] Admin By: Asencion Islam

## 2023-03-21 NOTE — Addendum Note (Signed)
Addended by: Isabell Jarvis on: 03/21/2023 12:34 PM   Modules accepted: Level of Service

## 2023-04-01 ENCOUNTER — Other Ambulatory Visit: Payer: Self-pay | Admitting: Internal Medicine

## 2023-04-09 DIAGNOSIS — H6992 Unspecified Eustachian tube disorder, left ear: Secondary | ICD-10-CM | POA: Diagnosis not present

## 2023-04-09 DIAGNOSIS — H9193 Unspecified hearing loss, bilateral: Secondary | ICD-10-CM | POA: Diagnosis not present

## 2023-04-09 DIAGNOSIS — H61303 Acquired stenosis of external ear canal, unspecified, bilateral: Secondary | ICD-10-CM | POA: Diagnosis not present

## 2023-04-16 ENCOUNTER — Other Ambulatory Visit: Payer: Self-pay

## 2023-04-16 MED ORDER — BISOPROLOL FUMARATE 5 MG PO TABS: 5.0000 mg | ORAL_TABLET | Freq: Every day | ORAL | 1 refills | Status: DC

## 2023-04-16 MED ORDER — AMLODIPINE BESYLATE 10 MG PO TABS: 10.0000 mg | ORAL_TABLET | Freq: Every day | ORAL | 1 refills | Status: DC

## 2023-04-16 MED ORDER — GABAPENTIN 600 MG PO TABS: 600.0000 mg | ORAL_TABLET | Freq: Two times a day (BID) | ORAL | 1 refills | Status: DC

## 2023-04-16 MED ORDER — HYDRALAZINE HCL 25 MG PO TABS: 25.0000 mg | ORAL_TABLET | Freq: Two times a day (BID) | ORAL | 1 refills | Status: DC

## 2023-04-16 MED ORDER — OMEPRAZOLE 20 MG PO CPDR: 20.0000 mg | DELAYED_RELEASE_CAPSULE | Freq: Every day | ORAL | 1 refills | Status: DC

## 2023-04-16 MED ORDER — LISINOPRIL 40 MG PO TABS: 40.0000 mg | ORAL_TABLET | Freq: Every day | ORAL | 1 refills | Status: DC

## 2023-04-16 MED ORDER — CHLORTHALIDONE 25 MG PO TABS: 25.0000 mg | ORAL_TABLET | Freq: Every day | ORAL | 1 refills | Status: DC

## 2023-04-22 ENCOUNTER — Ambulatory Visit: Payer: Medicare HMO

## 2023-04-22 DIAGNOSIS — E538 Deficiency of other specified B group vitamins: Secondary | ICD-10-CM | POA: Diagnosis not present

## 2023-04-22 MED ORDER — CYANOCOBALAMIN 1000 MCG/ML IJ SOLN
1000.0000 ug | Freq: Once | INTRAMUSCULAR | Status: AC
  Administered 2023-04-22: 1000 ug via INTRAMUSCULAR

## 2023-04-22 NOTE — Addendum Note (Signed)
Addended byOda Cogan on: 04/22/2023 10:02 AM   Modules accepted: Level of Service

## 2023-05-16 ENCOUNTER — Other Ambulatory Visit: Payer: Self-pay | Admitting: Internal Medicine

## 2023-05-24 ENCOUNTER — Ambulatory Visit: Payer: Medicare HMO

## 2023-05-24 DIAGNOSIS — E538 Deficiency of other specified B group vitamins: Secondary | ICD-10-CM

## 2023-05-24 MED ORDER — CYANOCOBALAMIN 1000 MCG/ML IJ SOLN
1000.0000 ug | Freq: Once | INTRAMUSCULAR | Status: AC
  Administered 2023-05-24: 1000 ug via INTRAMUSCULAR

## 2023-05-24 NOTE — Progress Notes (Signed)
B12 injection 

## 2023-06-01 DIAGNOSIS — R6889 Other general symptoms and signs: Secondary | ICD-10-CM | POA: Diagnosis not present

## 2023-06-01 DIAGNOSIS — R58 Hemorrhage, not elsewhere classified: Secondary | ICD-10-CM | POA: Diagnosis not present

## 2023-06-02 ENCOUNTER — Emergency Department (HOSPITAL_COMMUNITY): Payer: Medicare HMO

## 2023-06-02 ENCOUNTER — Other Ambulatory Visit: Payer: Self-pay

## 2023-06-02 ENCOUNTER — Inpatient Hospital Stay (HOSPITAL_COMMUNITY): Payer: Medicare HMO

## 2023-06-02 ENCOUNTER — Inpatient Hospital Stay (HOSPITAL_COMMUNITY): Payer: Medicare HMO | Admitting: Certified Registered"

## 2023-06-02 ENCOUNTER — Encounter (HOSPITAL_COMMUNITY)
Admission: EM | Disposition: A | Discharge status: Home Health | Payer: Self-pay | Source: Home / Self Care | Attending: Internal Medicine

## 2023-06-02 ENCOUNTER — Encounter (HOSPITAL_COMMUNITY): Payer: Self-pay

## 2023-06-02 ENCOUNTER — Inpatient Hospital Stay (HOSPITAL_COMMUNITY)
Admission: EM | Admit: 2023-06-02 | Discharge: 2023-06-07 | DRG: 481 | Disposition: A | Discharge status: Home Health | Payer: Medicare HMO | Attending: Internal Medicine | Admitting: Internal Medicine

## 2023-06-02 DIAGNOSIS — I10 Essential (primary) hypertension: Secondary | ICD-10-CM | POA: Diagnosis present

## 2023-06-02 DIAGNOSIS — S72002A Fracture of unspecified part of neck of left femur, initial encounter for closed fracture: Secondary | ICD-10-CM | POA: Diagnosis not present

## 2023-06-02 DIAGNOSIS — S51012A Laceration without foreign body of left elbow, initial encounter: Secondary | ICD-10-CM | POA: Diagnosis present

## 2023-06-02 DIAGNOSIS — I129 Hypertensive chronic kidney disease with stage 1 through stage 4 chronic kidney disease, or unspecified chronic kidney disease: Secondary | ICD-10-CM | POA: Diagnosis not present

## 2023-06-02 DIAGNOSIS — N1832 Chronic kidney disease, stage 3b: Secondary | ICD-10-CM | POA: Diagnosis not present

## 2023-06-02 DIAGNOSIS — R9431 Abnormal electrocardiogram [ECG] [EKG]: Secondary | ICD-10-CM | POA: Diagnosis not present

## 2023-06-02 DIAGNOSIS — F32A Depression, unspecified: Secondary | ICD-10-CM | POA: Diagnosis present

## 2023-06-02 DIAGNOSIS — Z6827 Body mass index (BMI) 27.0-27.9, adult: Secondary | ICD-10-CM

## 2023-06-02 DIAGNOSIS — E663 Overweight: Secondary | ICD-10-CM | POA: Diagnosis present

## 2023-06-02 DIAGNOSIS — Z043 Encounter for examination and observation following other accident: Secondary | ICD-10-CM | POA: Diagnosis not present

## 2023-06-02 DIAGNOSIS — Z87891 Personal history of nicotine dependence: Secondary | ICD-10-CM | POA: Diagnosis not present

## 2023-06-02 DIAGNOSIS — M25572 Pain in left ankle and joints of left foot: Secondary | ICD-10-CM | POA: Diagnosis not present

## 2023-06-02 DIAGNOSIS — S72142A Displaced intertrochanteric fracture of left femur, initial encounter for closed fracture: Principal | ICD-10-CM | POA: Diagnosis present

## 2023-06-02 DIAGNOSIS — Z86011 Personal history of benign neoplasm of the brain: Secondary | ICD-10-CM

## 2023-06-02 DIAGNOSIS — E8809 Other disorders of plasma-protein metabolism, not elsewhere classified: Secondary | ICD-10-CM | POA: Diagnosis not present

## 2023-06-02 DIAGNOSIS — E871 Hypo-osmolality and hyponatremia: Secondary | ICD-10-CM | POA: Diagnosis not present

## 2023-06-02 DIAGNOSIS — G629 Polyneuropathy, unspecified: Secondary | ICD-10-CM | POA: Diagnosis present

## 2023-06-02 DIAGNOSIS — Z8249 Family history of ischemic heart disease and other diseases of the circulatory system: Secondary | ICD-10-CM | POA: Diagnosis not present

## 2023-06-02 DIAGNOSIS — N400 Enlarged prostate without lower urinary tract symptoms: Secondary | ICD-10-CM | POA: Diagnosis present

## 2023-06-02 DIAGNOSIS — N179 Acute kidney failure, unspecified: Secondary | ICD-10-CM | POA: Diagnosis not present

## 2023-06-02 DIAGNOSIS — E559 Vitamin D deficiency, unspecified: Secondary | ICD-10-CM | POA: Diagnosis present

## 2023-06-02 DIAGNOSIS — D62 Acute posthemorrhagic anemia: Secondary | ICD-10-CM | POA: Diagnosis not present

## 2023-06-02 DIAGNOSIS — Z79899 Other long term (current) drug therapy: Secondary | ICD-10-CM | POA: Diagnosis not present

## 2023-06-02 DIAGNOSIS — K219 Gastro-esophageal reflux disease without esophagitis: Secondary | ICD-10-CM | POA: Diagnosis present

## 2023-06-02 DIAGNOSIS — S51011A Laceration without foreign body of right elbow, initial encounter: Secondary | ICD-10-CM | POA: Diagnosis present

## 2023-06-02 DIAGNOSIS — M810 Age-related osteoporosis without current pathological fracture: Secondary | ICD-10-CM | POA: Diagnosis present

## 2023-06-02 DIAGNOSIS — W1839XA Other fall on same level, initial encounter: Secondary | ICD-10-CM | POA: Diagnosis present

## 2023-06-02 DIAGNOSIS — N1831 Chronic kidney disease, stage 3a: Secondary | ICD-10-CM | POA: Diagnosis not present

## 2023-06-02 DIAGNOSIS — I951 Orthostatic hypotension: Secondary | ICD-10-CM | POA: Diagnosis not present

## 2023-06-02 DIAGNOSIS — Y92008 Other place in unspecified non-institutional (private) residence as the place of occurrence of the external cause: Secondary | ICD-10-CM

## 2023-06-02 HISTORY — PX: INTRAMEDULLARY (IM) NAIL INTERTROCHANTERIC: SHX5875

## 2023-06-02 LAB — CBC WITH DIFFERENTIAL/PLATELET
Abs Immature Granulocytes: 0.05 10*3/uL (ref 0.00–0.07)
Basophils Absolute: 0 10*3/uL (ref 0.0–0.1)
Basophils Relative: 0 %
Eosinophils Absolute: 0 10*3/uL (ref 0.0–0.5)
Eosinophils Relative: 0 %
HCT: 29.6 % — ABNORMAL LOW (ref 39.0–52.0)
Hemoglobin: 10 g/dL — ABNORMAL LOW (ref 13.0–17.0)
Immature Granulocytes: 0 %
Lymphocytes Relative: 6 %
Lymphs Abs: 0.7 10*3/uL (ref 0.7–4.0)
MCH: 33.9 pg (ref 26.0–34.0)
MCHC: 33.8 g/dL (ref 30.0–36.0)
MCV: 100.3 fL — ABNORMAL HIGH (ref 80.0–100.0)
Monocytes Absolute: 0.6 10*3/uL (ref 0.1–1.0)
Monocytes Relative: 5 %
Neutro Abs: 11.4 10*3/uL — ABNORMAL HIGH (ref 1.7–7.7)
Neutrophils Relative %: 89 %
Platelets: 249 10*3/uL (ref 150–400)
RBC: 2.95 MIL/uL — ABNORMAL LOW (ref 4.22–5.81)
RDW: 13 % (ref 11.5–15.5)
WBC: 12.8 10*3/uL — ABNORMAL HIGH (ref 4.0–10.5)
nRBC: 0 % (ref 0.0–0.2)

## 2023-06-02 LAB — CBC
HCT: 27.2 % — ABNORMAL LOW (ref 39.0–52.0)
HCT: 29.5 % — ABNORMAL LOW (ref 39.0–52.0)
Hemoglobin: 9 g/dL — ABNORMAL LOW (ref 13.0–17.0)
Hemoglobin: 9.6 g/dL — ABNORMAL LOW (ref 13.0–17.0)
MCH: 32.6 pg (ref 26.0–34.0)
MCH: 33.4 pg (ref 26.0–34.0)
MCHC: 33.1 g/dL (ref 30.0–36.0)
MCHC: 32.5 g/dL (ref 30.0–36.0)
MCV: 98.6 fL (ref 80.0–100.0)
MCV: 102.8 fL — ABNORMAL HIGH (ref 80.0–100.0)
Platelets: 256 10*3/uL (ref 150–400)
Platelets: 268 10*3/uL (ref 150–400)
RBC: 2.76 MIL/uL — ABNORMAL LOW (ref 4.22–5.81)
RBC: 2.87 MIL/uL — ABNORMAL LOW (ref 4.22–5.81)
RDW: 13 % (ref 11.5–15.5)
RDW: 13 % (ref 11.5–15.5)
WBC: 7.6 10*3/uL (ref 4.0–10.5)
WBC: 12.7 10*3/uL — ABNORMAL HIGH (ref 4.0–10.5)
nRBC: 0 % (ref 0.0–0.2)
nRBC: 0 % (ref 0.0–0.2)

## 2023-06-02 LAB — SURGICAL PCR SCREEN
MRSA, PCR: NEGATIVE
Staphylococcus aureus: POSITIVE — AB

## 2023-06-02 LAB — BASIC METABOLIC PANEL
Anion gap: 9 (ref 5–15)
BUN: 32 mg/dL — ABNORMAL HIGH (ref 8–23)
CO2: 22 mmol/L (ref 22–32)
Calcium: 8.5 mg/dL — ABNORMAL LOW (ref 8.9–10.3)
Chloride: 102 mmol/L (ref 98–111)
Creatinine, Ser: 1.39 mg/dL — ABNORMAL HIGH (ref 0.61–1.24)
GFR, Estimated: 48 mL/min — ABNORMAL LOW (ref >60)
Glucose, Bld: 120 mg/dL — ABNORMAL HIGH (ref 70–99)
Potassium: 4 mmol/L (ref 3.5–5.1)
Sodium: 133 mmol/L — ABNORMAL LOW (ref 135–145)

## 2023-06-02 LAB — COMPREHENSIVE METABOLIC PANEL
ALT: 15 U/L (ref 0–44)
AST: 24 U/L (ref 15–41)
Albumin: 3.6 g/dL (ref 3.5–5.0)
Alkaline Phosphatase: 52 U/L (ref 38–126)
Anion gap: 10 (ref 5–15)
BUN: 40 mg/dL — ABNORMAL HIGH (ref 8–23)
CO2: 20 mmol/L — ABNORMAL LOW (ref 22–32)
Calcium: 8.7 mg/dL — ABNORMAL LOW (ref 8.9–10.3)
Chloride: 102 mmol/L (ref 98–111)
Creatinine, Ser: 1.65 mg/dL — ABNORMAL HIGH (ref 0.61–1.24)
GFR, Estimated: 39 mL/min — ABNORMAL LOW (ref >60)
Glucose, Bld: 128 mg/dL — ABNORMAL HIGH (ref 70–99)
Potassium: 4 mmol/L (ref 3.5–5.1)
Sodium: 132 mmol/L — ABNORMAL LOW (ref 135–145)
Total Bilirubin: 0.4 mg/dL (ref 0.3–1.2)
Total Protein: 6.2 g/dL — ABNORMAL LOW (ref 6.5–8.1)

## 2023-06-02 LAB — TYPE AND SCREEN: ABO/RH(D): O POS

## 2023-06-02 LAB — CREATININE, SERUM
Creatinine, Ser: 1.28 mg/dL — ABNORMAL HIGH (ref 0.61–1.24)
GFR, Estimated: 53 mL/min — ABNORMAL LOW (ref >60)

## 2023-06-02 LAB — PROTIME-INR
INR: 1 (ref 0.8–1.2)
Prothrombin Time: 13.8 seconds (ref 11.4–15.2)

## 2023-06-02 LAB — ABO/RH: ABO/RH(D): O POS

## 2023-06-02 LAB — VITAMIN D 25 HYDROXY (VIT D DEFICIENCY, FRACTURES): Vit D, 25-Hydroxy: 55.32 ng/mL (ref 30–100)

## 2023-06-02 SURGERY — FIXATION, FRACTURE, INTERTROCHANTERIC, WITH INTRAMEDULLARY ROD
Anesthesia: General | Site: Hip | Laterality: Left

## 2023-06-02 MED ORDER — DOCUSATE SODIUM 100 MG PO CAPS
100.0000 mg | ORAL_CAPSULE | Freq: Two times a day (BID) | ORAL | Status: DC
  Administered 2023-06-02 – 2023-06-07 (×10): 100 mg via ORAL
  Filled 2023-06-02 (×10): qty 1

## 2023-06-02 MED ORDER — PROPOFOL 10 MG/ML IV BOLUS
INTRAVENOUS | Status: DC | PRN
  Administered 2023-06-02: 120 mg via INTRAVENOUS

## 2023-06-02 MED ORDER — FENTANYL CITRATE (PF) 100 MCG/2ML IJ SOLN
INTRAMUSCULAR | Status: AC
  Filled 2023-06-02: qty 2

## 2023-06-02 MED ORDER — ACETAMINOPHEN 500 MG PO TABS
1000.0000 mg | ORAL_TABLET | Freq: Four times a day (QID) | ORAL | Status: DC
  Administered 2023-06-03 – 2023-06-07 (×16): 1000 mg via ORAL
  Filled 2023-06-02 (×18): qty 2

## 2023-06-02 MED ORDER — METOCLOPRAMIDE HCL 5 MG PO TABS: 5.0000 mg | ORAL_TABLET | Freq: Three times a day (TID) | ORAL | Status: DC | PRN

## 2023-06-02 MED ORDER — EPHEDRINE SULFATE-NACL 50-0.9 MG/10ML-% IV SOSY
PREFILLED_SYRINGE | INTRAVENOUS | Status: DC | PRN
  Administered 2023-06-02 (×3): 5 mg via INTRAVENOUS

## 2023-06-02 MED ORDER — BISOPROLOL FUMARATE 5 MG PO TABS
5.0000 mg | ORAL_TABLET | Freq: Every day | ORAL | Status: DC
  Administered 2023-06-02 – 2023-06-07 (×6): 5 mg via ORAL
  Filled 2023-06-02 (×6): qty 1

## 2023-06-02 MED ORDER — POLYETHYLENE GLYCOL 3350 17 G PO PACK: 17.0000 g | PACK | Freq: Every day | ORAL | Status: DC | PRN

## 2023-06-02 MED ORDER — HYDROMORPHONE HCL 1 MG/ML IJ SOLN
INTRAMUSCULAR | Status: AC
  Filled 2023-06-02: qty 1

## 2023-06-02 MED ORDER — METHOCARBAMOL 500 MG PO TABS
500.0000 mg | ORAL_TABLET | Freq: Once | ORAL | Status: AC
  Administered 2023-06-02: 500 mg via ORAL

## 2023-06-02 MED ORDER — FENTANYL CITRATE (PF) 100 MCG/2ML IJ SOLN
25.0000 ug | INTRAMUSCULAR | Status: DC | PRN
  Administered 2023-06-02 (×2): 25 ug via INTRAVENOUS
  Administered 2023-06-02 (×2): 50 ug via INTRAVENOUS

## 2023-06-02 MED ORDER — SODIUM CHLORIDE 0.9 % IV SOLN: INTRAVENOUS | Status: AC

## 2023-06-02 MED ORDER — ONDANSETRON HCL 4 MG/2ML IJ SOLN
INTRAMUSCULAR | Status: DC | PRN
  Administered 2023-06-02: 4 mg via INTRAVENOUS

## 2023-06-02 MED ORDER — ACETAMINOPHEN 10 MG/ML IV SOLN
INTRAVENOUS | Status: AC
  Filled 2023-06-02: qty 100

## 2023-06-02 MED ORDER — HYDROMORPHONE HCL 1 MG/ML IJ SOLN
0.5000 mg | INTRAMUSCULAR | Status: DC | PRN
  Administered 2023-06-02 (×2): 0.5 mg via INTRAVENOUS

## 2023-06-02 MED ORDER — ONDANSETRON HCL 4 MG/2ML IJ SOLN: 4.0000 mg | Freq: Four times a day (QID) | INTRAMUSCULAR | Status: DC | PRN

## 2023-06-02 MED ORDER — CEFAZOLIN SODIUM-DEXTROSE 2-4 GM/100ML-% IV SOLN
INTRAVENOUS | Status: AC
  Filled 2023-06-02: qty 100

## 2023-06-02 MED ORDER — HYDROMORPHONE HCL 1 MG/ML IJ SOLN
0.5000 mg | Freq: Once | INTRAMUSCULAR | Status: AC
  Administered 2023-06-02: 0.5 mg via INTRAVENOUS
  Filled 2023-06-02: qty 1

## 2023-06-02 MED ORDER — OXYCODONE HCL 5 MG PO TABS
5.0000 mg | ORAL_TABLET | ORAL | Status: DC | PRN
  Administered 2023-06-02 – 2023-06-06 (×8): 5 mg via ORAL
  Filled 2023-06-02 (×8): qty 1

## 2023-06-02 MED ORDER — VANCOMYCIN HCL 1000 MG IV SOLR
INTRAVENOUS | Status: AC
  Filled 2023-06-02: qty 20

## 2023-06-02 MED ORDER — CHLORHEXIDINE GLUCONATE 0.12 % MT SOLN
15.0000 mL | Freq: Once | OROMUCOSAL | Status: AC
  Administered 2023-06-02: 15 mL via OROMUCOSAL

## 2023-06-02 MED ORDER — LIDOCAINE 2% (20 MG/ML) 5 ML SYRINGE
INTRAMUSCULAR | Status: DC | PRN
  Administered 2023-06-02: 60 mg via INTRAVENOUS

## 2023-06-02 MED ORDER — ACETAMINOPHEN 10 MG/ML IV SOLN
1000.0000 mg | Freq: Once | INTRAVENOUS | Status: DC | PRN
  Administered 2023-06-02: 1000 mg via INTRAVENOUS

## 2023-06-02 MED ORDER — CEFAZOLIN SODIUM-DEXTROSE 2-4 GM/100ML-% IV SOLN
2.0000 g | Freq: Once | INTRAVENOUS | Status: AC
  Administered 2023-06-02: 2 g via INTRAVENOUS

## 2023-06-02 MED ORDER — METHOCARBAMOL 500 MG PO TABS
500.0000 mg | ORAL_TABLET | Freq: Four times a day (QID) | ORAL | Status: DC | PRN
  Administered 2023-06-03 – 2023-06-06 (×3): 500 mg via ORAL
  Filled 2023-06-02 (×3): qty 1

## 2023-06-02 MED ORDER — ENOXAPARIN SODIUM 40 MG/0.4ML IJ SOSY
40.0000 mg | PREFILLED_SYRINGE | INTRAMUSCULAR | Status: DC
  Administered 2023-06-03 – 2023-06-07 (×5): 40 mg via SUBCUTANEOUS
  Filled 2023-06-02 (×5): qty 0.4

## 2023-06-02 MED ORDER — HYDRALAZINE HCL 25 MG PO TABS
25.0000 mg | ORAL_TABLET | Freq: Two times a day (BID) | ORAL | Status: DC
  Administered 2023-06-02 – 2023-06-03 (×2): 25 mg via ORAL
  Filled 2023-06-02 (×2): qty 1

## 2023-06-02 MED ORDER — METOCLOPRAMIDE HCL 5 MG/ML IJ SOLN: 5.0000 mg | Freq: Three times a day (TID) | INTRAMUSCULAR | Status: DC | PRN

## 2023-06-02 MED ORDER — DEXAMETHASONE SODIUM PHOSPHATE 10 MG/ML IJ SOLN
INTRAMUSCULAR | Status: DC | PRN
  Administered 2023-06-02: 5 mg via INTRAVENOUS

## 2023-06-02 MED ORDER — CEFAZOLIN SODIUM-DEXTROSE 2-4 GM/100ML-% IV SOLN
2.0000 g | Freq: Three times a day (TID) | INTRAVENOUS | Status: AC
  Administered 2023-06-03 (×3): 2 g via INTRAVENOUS
  Filled 2023-06-02 (×3): qty 100

## 2023-06-02 MED ORDER — PROPOFOL 10 MG/ML IV BOLUS
INTRAVENOUS | Status: AC
  Filled 2023-06-02: qty 20

## 2023-06-02 MED ORDER — VANCOMYCIN HCL 1000 MG IV SOLR
INTRAVENOUS | Status: DC | PRN
  Administered 2023-06-02: 1000 mg via TOPICAL

## 2023-06-02 MED ORDER — CHLORHEXIDINE GLUCONATE CLOTH 2 % EX PADS
6.0000 | MEDICATED_PAD | Freq: Every day | CUTANEOUS | Status: AC
  Administered 2023-06-02 – 2023-06-06 (×4): 6 via TOPICAL

## 2023-06-02 MED ORDER — PANTOPRAZOLE SODIUM 40 MG PO TBEC
40.0000 mg | DELAYED_RELEASE_TABLET | Freq: Every day | ORAL | Status: DC
  Administered 2023-06-02 – 2023-06-07 (×6): 40 mg via ORAL
  Filled 2023-06-02 (×6): qty 1

## 2023-06-02 MED ORDER — FENTANYL CITRATE (PF) 250 MCG/5ML IJ SOLN
INTRAMUSCULAR | Status: AC
  Filled 2023-06-02: qty 5

## 2023-06-02 MED ORDER — GABAPENTIN 300 MG PO CAPS
600.0000 mg | ORAL_CAPSULE | Freq: Two times a day (BID) | ORAL | Status: DC
  Administered 2023-06-02 – 2023-06-07 (×10): 600 mg via ORAL
  Filled 2023-06-02: qty 6
  Filled 2023-06-02: qty 2
  Filled 2023-06-02: qty 6
  Filled 2023-06-02 (×8): qty 2

## 2023-06-02 MED ORDER — HYDROMORPHONE HCL 1 MG/ML IJ SOLN: 0.5000 mg | INTRAMUSCULAR | Status: DC | PRN

## 2023-06-02 MED ORDER — TRANEXAMIC ACID-NACL 1000-0.7 MG/100ML-% IV SOLN
1000.0000 mg | Freq: Once | INTRAVENOUS | Status: AC
  Administered 2023-06-02: 1000 mg via INTRAVENOUS
  Filled 2023-06-02: qty 100

## 2023-06-02 MED ORDER — FENTANYL CITRATE (PF) 250 MCG/5ML IJ SOLN
INTRAMUSCULAR | Status: DC | PRN
  Administered 2023-06-02: 100 ug via INTRAVENOUS

## 2023-06-02 MED ORDER — SENNOSIDES-DOCUSATE SODIUM 8.6-50 MG PO TABS: 1.0000 | ORAL_TABLET | Freq: Every evening | ORAL | Status: DC | PRN

## 2023-06-02 MED ORDER — AMLODIPINE BESYLATE 10 MG PO TABS
10.0000 mg | ORAL_TABLET | Freq: Every day | ORAL | Status: DC
  Administered 2023-06-02 – 2023-06-03 (×2): 10 mg via ORAL
  Filled 2023-06-02 (×2): qty 1

## 2023-06-02 MED ORDER — ROCURONIUM BROMIDE 10 MG/ML (PF) SYRINGE
PREFILLED_SYRINGE | INTRAVENOUS | Status: DC | PRN
  Administered 2023-06-02: 60 mg via INTRAVENOUS

## 2023-06-02 MED ORDER — LACTATED RINGERS IV SOLN: INTRAVENOUS | Status: DC

## 2023-06-02 MED ORDER — SUGAMMADEX SODIUM 200 MG/2ML IV SOLN
INTRAVENOUS | Status: DC | PRN
  Administered 2023-06-02 (×2): 100 mg via INTRAVENOUS

## 2023-06-02 MED ORDER — METHOCARBAMOL 500 MG PO TABS
ORAL_TABLET | ORAL | Status: AC
  Filled 2023-06-02: qty 1

## 2023-06-02 MED ORDER — MUPIROCIN 2 % EX OINT
1.0000 | TOPICAL_OINTMENT | Freq: Two times a day (BID) | CUTANEOUS | Status: AC
  Administered 2023-06-02 – 2023-06-06 (×10): 1 via NASAL
  Filled 2023-06-02 (×2): qty 22

## 2023-06-02 MED ORDER — ORAL CARE MOUTH RINSE: 15.0000 mL | Freq: Once | OROMUCOSAL | Status: AC

## 2023-06-02 SURGICAL SUPPLY — 49 items
ADH SKN CLS APL DERMABOND .7 (GAUZE/BANDAGES/DRESSINGS) ×1
APL PRP STRL LF DISP 70% ISPRP (MISCELLANEOUS) ×1
BAG COUNTER SPONGE SURGICOUNT (BAG) IMPLANT
BAG SPNG CNTER NS LX DISP (BAG)
BIT DRILL INTERTAN LAG SCREW (BIT) IMPLANT
BIT DRILL LONG 4.0 (BIT) IMPLANT
BRUSH SCRUB EZ PLAIN DRY (MISCELLANEOUS) ×2 IMPLANT
CHLORAPREP W/TINT 26 (MISCELLANEOUS) ×1 IMPLANT
COVER PERINEAL POST (MISCELLANEOUS) ×1 IMPLANT
COVER SURGICAL LIGHT HANDLE (MISCELLANEOUS) ×1 IMPLANT
DERMABOND ADVANCED .7 DNX12 (GAUZE/BANDAGES/DRESSINGS) ×1 IMPLANT
DRAPE C-ARM 35X43 STRL (DRAPES) ×1 IMPLANT
DRAPE IMP U-DRAPE 54X76 (DRAPES) ×2 IMPLANT
DRAPE INCISE IOBAN 66X45 STRL (DRAPES) ×1 IMPLANT
DRAPE STERI IOBAN 125X83 (DRAPES) ×1 IMPLANT
DRAPE SURG 17X23 STRL (DRAPES) ×2 IMPLANT
DRAPE U-SHAPE 47X51 STRL (DRAPES) ×1 IMPLANT
DRESSING MEPILEX FLEX 4X4 (GAUZE/BANDAGES/DRESSINGS) ×1 IMPLANT
DRILL BIT LONG 4.0 (BIT) ×1
DRSG MEPILEX FLEX 4X4 (GAUZE/BANDAGES/DRESSINGS) ×1
DRSG MEPILEX POST OP 4X8 (GAUZE/BANDAGES/DRESSINGS) ×1 IMPLANT
DRSG MEPITEL 4X7.2 (GAUZE/BANDAGES/DRESSINGS) IMPLANT
ELECT REM PT RETURN 9FT ADLT (ELECTROSURGICAL) ×1
ELECTRODE REM PT RTRN 9FT ADLT (ELECTROSURGICAL) ×1 IMPLANT
GLOVE BIO SURGEON STRL SZ 6.5 (GLOVE) ×3 IMPLANT
GLOVE BIO SURGEON STRL SZ7.5 (GLOVE) ×4 IMPLANT
GLOVE BIOGEL PI IND STRL 6.5 (GLOVE) ×1 IMPLANT
GLOVE BIOGEL PI IND STRL 7.5 (GLOVE) ×1 IMPLANT
GOWN STRL REUS W/ TWL LRG LVL3 (GOWN DISPOSABLE) ×1 IMPLANT
GOWN STRL REUS W/TWL LRG LVL3 (GOWN DISPOSABLE) ×1
GUIDE PIN 3.2X343 (PIN) ×2
GUIDE PIN 3.2X343MM (PIN) ×2
KIT BASIN OR (CUSTOM PROCEDURE TRAY) ×1 IMPLANT
KIT TURNOVER KIT B (KITS) ×1 IMPLANT
MANIFOLD NEPTUNE II (INSTRUMENTS) ×1 IMPLANT
NAIL INTERTAN 10X18 130D 10S (Nail) IMPLANT
NS IRRIG 1000ML POUR BTL (IV SOLUTION) ×1 IMPLANT
PACK GENERAL/GYN (CUSTOM PROCEDURE TRAY) ×1 IMPLANT
PAD ARMBOARD 7.5X6 YLW CONV (MISCELLANEOUS) ×2 IMPLANT
PIN GUIDE 3.2X343MM (PIN) IMPLANT
SCREW LAG COMPR KIT 110/105 (Screw) IMPLANT
SCREW TRIGEN LOW PROF 5.0X40 (Screw) IMPLANT
SUT MNCRL AB 3-0 PS2 18 (SUTURE) ×1 IMPLANT
SUT VIC AB 0 CT1 27 (SUTURE)
SUT VIC AB 0 CT1 27XBRD ANBCTR (SUTURE) IMPLANT
SUT VIC AB 2-0 CT1 27 (SUTURE) ×1
SUT VIC AB 2-0 CT1 TAPERPNT 27 (SUTURE) ×2 IMPLANT
TOWEL GREEN STERILE (TOWEL DISPOSABLE) ×2 IMPLANT
WATER STERILE IRR 1000ML POUR (IV SOLUTION) ×1 IMPLANT
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
## 2023-06-02 NOTE — Progress Notes (Signed)
    Patient: Andrew Montgomery MVH:[id number] DOB: 05-06-1932      Brief hospital course: [name] is an 89 y.o. M with HTN, meningioma, CKD IIIa baseline 1.5 who presented with fall and left hip fracture.    This is a no charge note, for further details, please see the H&P by my partner, Dr. Antionette Char from earlier today.   Principal Problem:   Closed left hip fracture, initial encounter [facility]) Active Problems:   Essential hypertension   CKD stage 3b, GFR 30-44 ml/min (HCC)    To the OR today        Physical Exam: BP (!) 156/66   Pulse (!) 53   Temp 97.8 F (36.6 C) (Oral)   Resp 17   Ht 5' 9.02" (1.753 m)   Wt 83 kg   SpO2 97%   BMI 27.01 kg/m   Patient seen and examined.             Author: Alberteen Sam, MD 06/02/2023 3:24 PM

## 2023-06-02 NOTE — Interval H&P Note (Signed)
History and Physical Interval Note:  06/02/2023 3:14 PM  Andrew Montgomery  has presented today for surgery, with the diagnosis of Left intertrochanteric femur fracture.  The various methods of treatment have been discussed with the patient and family. After consideration of risks, benefits and other options for treatment, the patient has consented to  Procedure(s): INTRAMEDULLARY (IM) NAIL INTERTROCHANTERIC (Left) as a surgical intervention.  The patient's history has been reviewed, patient examined, no change in status, stable for surgery.  I have reviewed the patient's chart and labs.  Questions were answered to the patient's satisfaction.     Caryn Bee P Kaylon Laroche
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
## 2023-06-02 NOTE — Anesthesia Procedure Notes (Signed)
Procedure Name: Intubation Date/Time: 06/02/2023 3:35 PM  Performed by: Laruth Bouchard., CRNAPre-anesthesia Checklist: Patient identified, Emergency Drugs available, Suction available, Patient being monitored and Timeout performed Patient Re-evaluated:Patient Re-evaluated prior to induction Oxygen Delivery Method: Circle system utilized Preoxygenation: Pre-oxygenation with 100% oxygen Induction Type: IV induction Ventilation: Mask ventilation without difficulty Laryngoscope Size: Mac and 4 Grade View: Grade I Tube type: Oral Tube size: 7.5 mm Number of attempts: 1 Airway Equipment and Method: Stylet Placement Confirmation: ETT inserted through vocal cords under direct vision, positive ETCO2 and breath sounds checked- equal and bilateral Secured at: 22 cm Tube secured with: Tape Dental Injury: Teeth and Oropharynx as per pre-operative assessment

## 2023-06-02 NOTE — Anesthesia Preprocedure Evaluation (Signed)
Anesthesia Evaluation  Patient identified by MRN, date of birth, ID band Patient awake    Reviewed: Allergy & Precautions, NPO status , Patient's Chart, lab work & pertinent test results  Airway Mallampati: II  TM Distance: >3 FB Neck ROM: Full    Dental no notable dental hx.    Pulmonary neg pulmonary ROS, former smoker   Pulmonary exam normal        Cardiovascular hypertension, Pt. on medications and Pt. on home beta blockers  Rhythm:Regular Rate:Normal     Neuro/Psych    Depression    negative neurological ROS     GI/Hepatic Neg liver ROS,GERD  Medicated,,  Endo/Other  negative endocrine ROS    Renal/GU   negative genitourinary   Musculoskeletal  (+) Arthritis , Osteoarthritis,    Abdominal Normal abdominal exam  (+)   Peds  Hematology negative hematology ROS (+)   Anesthesia Other Findings   Reproductive/Obstetrics                             Anesthesia Physical Anesthesia Plan  ASA: 2  Anesthesia Plan: General   Post-op Pain Management:    Induction: Intravenous  PONV Risk Score and Plan: 2 and Ondansetron, Dexamethasone and Treatment may vary due to age or medical condition  Airway Management Planned: Mask and Oral ETT  Additional Equipment: None  Intra-op Plan:   Post-operative Plan: Extubation in OR  Informed Consent: I have reviewed the patients History and Physical, chart, labs and discussed the procedure including the risks, benefits and alternatives for the proposed anesthesia with the patient or authorized representative who has indicated his/her understanding and acceptance.     Dental advisory given  Plan Discussed with: CRNA  Anesthesia Plan Comments:        Anesthesia Quick Evaluation

## 2023-06-02 NOTE — Hospital Course (Addendum)
Andrew Montgomery is an 89 y.o. M with HTN, meningioma, CKD IIIa baseline 1.5 who presented with fall and left hip fracture.  He underwent cephalomedullary nail fixation and is postoperative day 3.  PT OT recommending home health.  Hospitalization has been complicated by orthostatic hypotension and syncope.  Also complicated by acute blood loss anemia as a postoperative cause and is getting typed and screened and transfused 1 unit PRBCs.  Patient subsequently improved and was no longer orthostatic on repeat.  He is deemed medically stable for discharge at this time and will need to follow-up with PCP, and orthopedic surgery in outpatient setting  Assessment and Plan:   Left Hip Intertrochanteric Fracture   -Patient had cephalomedullary nail fixation and is Postoperative day 3 -Hemoglobin has dropped from 8.7 -> 7.3 -> 7.1. and will type and screen and transfuse 1 unit  -Patient had a syncopal episode secondary to orthostatsis currently being Treated with IV fluids -DVT prophylaxis with Enoxaparin -Orthopedic surgery following and continue pain regimen with Acetaminophen 1000 mg every 6h, gabapentin 600 g p.o. twice daily, hydromorphone 0.5 mg IV every 3 as needed for severe pain, methocarbamol 500 mg p.o. every 6 as needed for muscle spasms, and oxycodone 5 mg p.o. every 4 as needed moderate pain -Bowel regimen with senna docusate 1 tab p.o. nightly as needed, MiraLAX 17 g p.o. daily as needed for mild constipation and docusate 100 mg p.o. twice daily -Orthopedic surgery recommending weightbearing as tolerated to left lower extremity with unrestricted range of motion and changing Mepilex as needed; they are recommending enoxaparin while hospitalized and changing to Eliquis 2.5 mg p.o. twice daily for 30 days for DVT prophylaxis. -PT OT recommending home health now that he is medically stable   Hypertension -> Orthostatic Hypotension, improved -Hold all antihypertensives given patient is orthostatic  positive treat with IV fluid bolus  -Continues to have soft BP and continuing Bisoprolol 5 mg po Daily -Had Orthostatic Hypotension so will order TED Hose and Abdominal Binder -Started normal saline at 75 MLS per hour but will stop and give a Bolus; Repeat Orthostatics in the AM -BP fluctuating and Orthostatics done and went from 144/56 Lying -> 159/68 Sitting -> 126/62 Standing and then 118/54 Standing at 3 Min -Continue to Monitor BP per Protocol -Last BP reading was 126/62   AKI on CKD 3a  -SCr is 1.65 on admission, was 1.39 in March 2024  -Baseline Cr is around 1.5 -BUN/Cr Trend: Recent Labs  Lab 06/02/23 0047 06/02/23 0610 06/02/23 2053 06/03/23 0252 06/04/23 0226 06/05/23 0117 06/06/23 0421 06/07/23 0227  BUN 40* 32*  --  26* 31* 30* 26* 23  CREATININE 1.65* 1.39* 1.28* 1.23 1.40* 1.28* 1.03 1.06  -Ordered normal saline at 75 MLS per hour -Avoid Nephrotoxic Medications, Contrast Dyes, Hypotension and Dehydration to Ensure Adequate Renal Perfusion and will need to Renally Adjust Meds -Continue to Monitor and Trend Renal Function carefully and repeat CMP within 1 week  Hypomagnesemia -Patient's Mag Level Trend: Recent Labs  Lab 06/05/23 0117 06/06/23 0421 06/07/23 0227  MG 1.5* 1.8 1.8  -Replete with IV Mag Sulfate 2 Grams prior to discharge -Continue to Monitor and Replete as Necessary -Repeat Mag  within 1 week  Postoperative Acute Blood Loss Anemia -Hgb/Hct Trend: Recent Labs  Lab 06/04/23 0226 06/05/23 0117 06/05/23 1205 06/05/23 2336 06/06/23 0421 06/06/23 1253 06/07/23 0227  HGB 7.3* 7.1* 7.1* 8.8* 8.2* 8.0* 7.9*  HCT 21.8* 21.4* 21.4* 26.3* 24.7* 24.6* 23.3*  MCV 100.9* 101.4*  100.9*  --  98.0 99.2 98.7  -Typed and Screen and Transfuse 1 unit of pRBCs -Continue to Monitor for S/Sx of Bleeding; No overt bleeding noted -Repeat CBC within 1 week given that hemoglobin is stable   Neuropathy  -Continue Gabapentin 600 mg po BID   GERD/GI  Prophylaxis -Continue PPI with Pantoprazole 40 mg po Daily  Hypoalbuminemia -Patient's Albumin Trend: Recent Labs  Lab 06/02/23 0047 06/04/23 0226 06/05/23 0117 06/06/23 0421 06/07/23 0227  ALBUMIN 3.6 2.8* 2.7* 2.7* 2.7*  -Continue to Monitor and Trend and repeat CMP within 1 week

## 2023-06-02 NOTE — Anesthesia Postprocedure Evaluation (Signed)
Anesthesia Post Note  Patient: Andrew Montgomery  Procedure(s) Performed: INTRAMEDULLARY (IM) NAIL INTERTROCHANTERIC (Left: Hip)     Patient location during evaluation: PACU Anesthesia Type: General Level of consciousness: awake and alert Pain management: pain level controlled Vital Signs Assessment: post-procedure vital signs reviewed and stable Respiratory status: spontaneous breathing, nonlabored ventilation, respiratory function stable and patient connected to nasal cannula oxygen Cardiovascular status: blood pressure returned to baseline and stable Postop Assessment: no apparent nausea or vomiting Anesthetic complications: no   No notable events documented.  Last Vitals:  Vitals:   06/02/23 1815 06/02/23 1838  BP: (!) 128/51 (!) 123/48  Pulse: (!) 57 (!) 56  Resp: 20   Temp: 36.7 C 36.4 C  SpO2: 96% 96%    Last Pain:  Vitals:   06/02/23 1838  TempSrc: Oral  PainSc:                  Nelle Don Donnell Beauchamp
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
## 2023-06-02 NOTE — ED Provider Notes (Signed)
[place] [facility] DEPARTMENT AT [facility] Provider Note   CSN: [id number] Arrival date & time: 06/02/23  0036     History  Chief Complaint  Patient presents with   Fall    Patient to ED from home via EMS with a complaint of mechanical fall and injury to left leg. Patient has multiple obvious deformities left leg. Patient denies taking blood thinners. EMS reports giving 200 mcg Fentanyl through an 18 gauge LAC.    Andrew Montgomery is a 89 y.o. male.  The history is provided by the patient and medical records.  Fall   89 y.o. M with hx of allergies, HTN, DJD, HTN, presenting to the ED after a mechanical fall.  States he was rolling to the garbage to the curb, sustained a fall as driveway is on a slight incline.  Tried to catch himself but scraped up his arms in the process.  Denies any head injury or LOC.  He was not able to stand back up and bear weight.  He has pain in the left hip and ankle.  Some abrasions to the elbows but denies any pain.  He is not on anticoagulation.  Generally walks unassisted but if out and about takes cane or walker with him.  Home Medications Prior to Admission medications   Medication Sig Start Date End Date Taking? Authorizing Provider  amLODipine (NORVASC) 10 MG tablet Take 1 tablet (10 mg total) by mouth daily. 04/16/23   Crist Fat, MD  bisoprolol (ZEBETA) 5 MG tablet TAKE 1 TABLET BY MOUTH DAILY 05/17/23   Leonia Reader, Barbara Cower, MD  chlorthalidone (HYGROTON) 25 MG tablet TAKE 1 TABLET BY MOUTH DAILY 05/17/23   Leonia Reader, Barbara Cower, MD  diclofenac (VOLTAREN) 75 MG EC tablet Take 1 tablet (75 mg total) by mouth 2 (two) times daily as needed. 01/17/23   Crist Fat, MD  gabapentin (NEURONTIN) 600 MG tablet Take 1 tablet (600 mg total) by mouth 2 (two) times daily. 04/16/23   Crist Fat, MD  hydrALAZINE (APRESOLINE) 25 MG tablet Take 1 tablet (25 mg total) by mouth 2 (two) times daily with a meal. 04/16/23   Leonia Reader, Barbara Cower, MD  lisinopril (ZESTRIL) 40  MG tablet Take 1 tablet (40 mg total) by mouth daily. 04/16/23   Crist Fat, MD  omeprazole (PRILOSEC) 20 MG capsule TAKE 1 CAPSULE BY MOUTH DAILY 05/17/23   Crist Fat, MD      Allergies    Patient has no known allergies.    Review of Systems   Review of Systems  Musculoskeletal:  Positive for arthralgias.  All other systems reviewed and are negative.   Physical Exam Updated Vital Signs BP (!) 152/75   Pulse 65   Temp 97.6 F (36.4 C)   Resp 17   Ht 5\' 9"  (1.753 m)   Wt 83 kg   SpO2 99%   BMI 27.02 kg/m   Physical Exam Vitals and nursing note reviewed.  Constitutional:      Appearance: He is well-developed.  HENT:     Head: Normocephalic and atraumatic.     Comments: No visible head trauma Eyes:     Conjunctiva/sclera: Conjunctivae normal.     Pupils: Pupils are equal, round, and reactive to light.  Cardiovascular:     Rate and Rhythm: Normal rate and regular rhythm.     Heart sounds: Normal heart sounds.  Pulmonary:     Effort: Pulmonary effort is normal.  Breath sounds: Normal breath sounds.  Abdominal:     General: Bowel sounds are normal.     Palpations: Abdomen is soft.  Musculoskeletal:        General: Normal range of motion.     Cervical back: Normal range of motion.     Comments: Tender along left hip but pelvis is stable, appears to have some leg shortening Left ankle is externally rotated and tender along medial aspect, DP pulse intact, wiggling toes on command Abrasions and skin tears noted to bilateral elbows but no pain with ROM testing  Skin:    General: Skin is warm and dry.  Neurological:     Mental Status: He is alert and oriented to person, place, and time.     Comments: AAOx3, moving extremities well aside of LLE due to injury, no focal deficits     ED Results / Procedures / Treatments   Labs (all labs ordered are listed, but only abnormal results are displayed) Labs Reviewed  CBC WITH DIFFERENTIAL/PLATELET - Abnormal;  Notable for the following components:      Result Value   WBC 12.8 (*)    RBC 2.95 (*)    Hemoglobin 10.0 (*)    HCT 29.6 (*)    MCV 100.3 (*)    Neutro Abs 11.4 (*)    All other components within normal limits  COMPREHENSIVE METABOLIC PANEL - Abnormal; Notable for the following components:   Sodium 132 (*)    CO2 20 (*)    Glucose, Bld 128 (*)    BUN 40 (*)    Creatinine, Ser 1.65 (*)    Calcium 8.7 (*)    Total Protein 6.2 (*)    GFR, Estimated 39 (*)    All other components within normal limits  PROTIME-INR  CBC  BASIC METABOLIC PANEL  TYPE AND SCREEN  ABO/RH    EKG None  Radiology DG Hip Unilat W or Wo Pelvis 2-3 Views Left  Result Date: 06/02/2023 CLINICAL DATA:  Left hip pain after fall EXAM: DG HIP (WITH OR WITHOUT PELVIS) 2-3V LEFT COMPARISON:  None Available. FINDINGS: Acute mildly displaced left intertrochanteric fracture. Slight superolateral displacement of the dominant fracture fragment. Medial displacement of the lesser trochanter fracture. No dislocation. Degenerative changes pubic symphysis, both hips, SI joints and lower lumbar spine. IMPRESSION: Intertrochanteric left femur fracture. Electronically Signed   By: Minerva Fester M.D.   On: 06/02/2023 01:33   DG Chest 1 View  Result Date: 06/02/2023 CLINICAL DATA:  Fall at home. EXAM: CHEST  1 VIEW COMPARISON:  07/11/2005. FINDINGS: The heart size and mediastinal contours are within normal limits. There is atherosclerotic calcification of the aorta. No consolidation, effusion, or pneumothorax. Degenerative changes are noted in the thoracic spine. No acute osseous abnormality. IMPRESSION: No active disease. Electronically Signed   By: Thornell Sartorius M.D.   On: 06/02/2023 01:32   DG Ankle Complete Left  Result Date: 06/02/2023 CLINICAL DATA:  Fall, left ankle pain EXAM: LEFT ANKLE COMPLETE - 3+ VIEW COMPARISON:  None Available. FINDINGS: Normal alignment. No acute fracture or dislocation. Midfoot degenerative  arthritis is partially visualized. Mild diffuse subcutaneous edema noted within the left ankle. No ankle effusion. IMPRESSION: 1. Mild diffuse subcutaneous edema. No acute fracture or dislocation. Electronically Signed   By: Helyn Numbers M.D.   On: 06/02/2023 01:32    Procedures Procedures    Medications Ordered in ED Medications - No data to display  ED Course/ Medical Decision Making/ A&P  Medical Decision Making Amount and/or Complexity of Data Reviewed Labs: ordered. Radiology: ordered and independent interpretation performed. ECG/medicine tests: ordered and independent interpretation performed.  Risk Prescription drug management. Decision regarding hospitalization.   89 year old male presenting to the ED with left leg injury after a mechanical fall while rolling his garbage can to the curb.  Sustained abrasions to both elbows and has pain in the left hip and ankle.  Denies any head injury or loss of consciousness.  He is fully awake alert, oriented x3 on exam.  He has no visible signs of head trauma.  He is not currently on anticoagulation.  Apparent deformity ankle on exam but his foot is neurovascularly intact.  Also has some tenderness of the left hip as well.  Will obtain films, labs, EKG.  Ankle films negative.  Left intertrochanteric fracture noted.  CXR clear.  Labs overall reassuring-- anemia with hemoglobin of 10, hx of same.  SrCr today 1.65, last on file was 1.49 as of 2 months ago.  Will need admission.  Results discussed with patient, he voiced understanding.  1:48 AM Spoke with on call orthopedics, Dr. Jena Gauss-- will review images, keep NPO and will try to take to OR later today.    Discussed with hospitalist, Dr. Antionette Char-- will admit for ongoing care.  Final Clinical Impression(s) / ED Diagnoses Final diagnoses:  Closed displaced intertrochanteric fracture of left femur, initial encounter [facility])    Rx / DC Orders ED Discharge Orders      None         Garlon Hatchet, PA-C 06/02/23 0214    Mesner, Barbara Cower, MD 06/02/23 [phone]

## 2023-06-02 NOTE — Progress Notes (Signed)
Orthopedic Tech Progress Note Patient Details:  Andrew Montgomery Jun 12, 1932 [id number]  Patient ID: Andrew Montgomery, male   DOB: 04/28/1932, 89 y.o.   MRN: [id number] Patient doesn't meet criteria for ohf. Patient must be under 70 to get ohf. Andrew Montgomery 06/02/2023, 5:53 AM

## 2023-06-02 NOTE — Plan of Care (Signed)

## 2023-06-02 NOTE — Transfer of Care (Signed)
Immediate Anesthesia Transfer of Care Note  Patient: Andrew Montgomery  Procedure(s) Performed: INTRAMEDULLARY (IM) NAIL INTERTROCHANTERIC (Left: Hip)  Patient Location: PACU  Anesthesia Type:General  Level of Consciousness: awake, alert , and oriented  Airway & Oxygen Therapy: Patient Spontanous Breathing and Patient connected to nasal cannula oxygen  Post-op Assessment: Report given to RN and Post -op Vital signs reviewed and stable  Post vital signs: Reviewed and stable  Last Vitals:  Vitals Value Taken Time  BP 134/50 06/02/23 1700  Temp 36.6 C 06/02/23 1650  Pulse 53 06/02/23 1704  Resp 10 06/02/23 1704  SpO2 93 % 06/02/23 1704  Vitals shown include unvalidated device data.  Last Pain:  Vitals:   06/02/23 1650  TempSrc:   PainSc: 7       Patients Stated Pain Goal: 3 (06/02/23 1438)  Complications: No notable events documented.

## 2023-06-02 NOTE — Op Note (Signed)
Orthopaedic Surgery Operative Note (CSN: [id number] ) Date of Surgery: 06/02/2023  Admit Date: 06/02/2023   Diagnoses: Pre-Op Diagnoses: Left intertrochanteric femur fracture  Post-Op Diagnosis: Same  Procedures: CPT 27245-Cephalomedullary nailing of left intertrochanteric femur fracture  Surgeons : Primary: Roby Lofts, MD  Assistant: Ulyses Southward, PA-C  Location: OR 3   Anesthesia: General   Antibiotics: Ancef 2g preop   Tourniquet time: None    Estimated Blood Loss: 75 mL  Complications: None  Specimens:None   Implants: Implant Name Type Inv. Item Serial No. Manufacturer Lot No. LRB No. Used Action  NAIL INTERTAN 10X18 130D 10S - [id number] Nail NAIL INTERTAN 10X18 130D 10S  [employer] [id number] Left 1 Implanted  SCREW LAG COMPR KIT 110/105 - [id number] Screw SCREW LAG COMPR KIT 110/105  [employer] [id number] Left 1 Implanted  SCREW TRIGEN LOW PROF 5.0X40 - [id number] Screw SCREW TRIGEN LOW PROF 5.0X40  [employer] [id number] Left 1 Implanted     Indications for Surgery: 89 year old male who had a ground-level fall with a left intertrochanteric femur fracture.  Due to the unstable nature of his injury I recommend proceeding with cephalomedullary nailing of his left hip.  Risks and benefits were discussed with the patient and his son.  Risks included but not limited to bleeding, infection, malunion, nonunion, hardware failure, hardware rotation, nerve or blood vessel injury, DVT, even the possibility anesthetic complications.  He agreed to proceed with surgery and consent was obtained.  Operative Findings: Cephalomedullary nailing of left intertrochanteric femur fracture using [employer] InterTAN 10 x 180 mm nail with 110 mm lag screw and 105 mm compression screw.  Procedure: The patient was identified in the preoperative holding area. Consent was confirmed with the patient and their family and all questions were  answered. The operative extremity was marked after confirmation with the patient. he was then brought back to the operating room by our anesthesia colleagues.  He was placed under general anesthetic and carefully transferred over to the [facility] table.  All bony prominences were well-padded.  Traction was applied to the left lower extremity and fluoroscopic imaging showed adequate reduction of the proximal femur.  The left lower extremity was then prepped and draped in usual sterile fashion.  A timeout was performed to verify the patient, the procedure, and the extremity.  Preoperative antibiotics were dosed.  Small incision proximal to the greater trochanter was made and carried down through skin and subcutaneous tissue.  I directed a threaded guidewire at the tip of the greater trochanter and advanced into the proximal metaphysis.  I then used an entry reamer to enter the medullary canal.  I then passed a 10 x 180 mm nail down the center of the canal attached to a targeting arm.  I then directed a threaded guidewire up into the head/neck segment.  I confirmed adequate tip apex distance with fluoroscopy and then measured the length and chose to use 110 mm lag screw.  I then drilled the path for the compression screw and placed an antirotation bar.  I then drilled the path for the lag screw and placed the lag screw.  I then compressed with the compression screw approximately 8 mm.  I then statically locked the proximal portion of the nail.  I then used the targeting arm to place a distal interlocking screw from lateral to medial.  I then removed the targeting arm and obtained final fluoroscopic imaging.  The incisions were irrigated and  closed with 2-0 Vicryl and Dermabond.  Sterile dressings were applied.  The patient was then awoke from anesthesia and taken to the PACU in stable condition.  Post Op Plan/Instructions: The patient be weightbearing as tolerated to the left lower extremity.  He will receive  postoperative Ancef.  He will receive Lovenox for DVT prophylaxis and discharged on an oral DOAC.  Will have him mobilize with physical and Occupational Therapy.  I was present and performed the entire surgery.  Ulyses Southward, PA-C did assist me throughout the case. An assistant was necessary given the difficulty in approach, maintenance of reduction and ability to instrument the fracture.   Truitt Merle, MD [facility]

## 2023-06-03 ENCOUNTER — Encounter (HOSPITAL_COMMUNITY): Payer: Self-pay | Admitting: Student

## 2023-06-03 DIAGNOSIS — S72002A Fracture of unspecified part of neck of left femur, initial encounter for closed fracture: Secondary | ICD-10-CM | POA: Diagnosis not present

## 2023-06-03 LAB — CBC
HCT: 26.7 % — ABNORMAL LOW (ref 39.0–52.0)
Hemoglobin: 8.7 g/dL — ABNORMAL LOW (ref 13.0–17.0)
MCH: 33.1 pg (ref 26.0–34.0)
MCHC: 32.6 g/dL (ref 30.0–36.0)
MCV: 101.5 fL — ABNORMAL HIGH (ref 80.0–100.0)
Platelets: 256 10*3/uL (ref 150–400)
RBC: 2.63 MIL/uL — ABNORMAL LOW (ref 4.22–5.81)
RDW: 13.1 % (ref 11.5–15.5)
WBC: 7.9 10*3/uL (ref 4.0–10.5)
nRBC: 0 % (ref 0.0–0.2)

## 2023-06-03 LAB — BASIC METABOLIC PANEL
Anion gap: 9 (ref 5–15)
BUN: 26 mg/dL — ABNORMAL HIGH (ref 8–23)
CO2: 23 mmol/L (ref 22–32)
Calcium: 8.3 mg/dL — ABNORMAL LOW (ref 8.9–10.3)
Chloride: 100 mmol/L (ref 98–111)
Creatinine, Ser: 1.23 mg/dL (ref 0.61–1.24)
GFR, Estimated: 56 mL/min — ABNORMAL LOW (ref >60)
Glucose, Bld: 152 mg/dL — ABNORMAL HIGH (ref 70–99)
Potassium: 4.8 mmol/L (ref 3.5–5.1)
Sodium: 132 mmol/L — ABNORMAL LOW (ref 135–145)

## 2023-06-03 MED ORDER — ADULT MULTIVITAMIN W/MINERALS CH
1.0000 | ORAL_TABLET | Freq: Every day | ORAL | Status: DC
  Administered 2023-06-03 – 2023-06-07 (×5): 1 via ORAL
  Filled 2023-06-03 (×5): qty 1

## 2023-06-03 MED ORDER — ENSURE ENLIVE PO LIQD
237.0000 mL | Freq: Two times a day (BID) | ORAL | Status: DC
  Administered 2023-06-03 – 2023-06-06 (×3): 237 mL via ORAL

## 2023-06-03 MED ORDER — SODIUM CHLORIDE 0.9 % IV BOLUS
500.0000 mL | Freq: Once | INTRAVENOUS | Status: AC
  Administered 2023-06-03: 500 mL via INTRAVENOUS

## 2023-06-03 NOTE — Plan of Care (Signed)
  Problem: Activity: Goal: Risk for activity intolerance will decrease Outcome: Progressing   Problem: Nutrition: Goal: Adequate nutrition will be maintained Outcome: Progressing   Problem: Coping: Goal: Level of anxiety will decrease Outcome: Progressing   Problem: Pain Managment: Goal: General experience of comfort will improve Outcome: Progressing   

## 2023-06-03 NOTE — Progress Notes (Signed)
Initial Nutrition Assessment  DOCUMENTATION CODES:   Not applicable  INTERVENTION:  Liberalize diet to regular; automatic trays Ensure Enlive po BID, each supplement provides 350 kcal and 20 grams of protein. MVI with minerals daily  NUTRITION DIAGNOSIS:   Increased nutrient needs related to post-op healing, hip fracture as evidenced by estimated needs.  GOAL:   Patient will meet greater than or equal to 90% of their needs  MONITOR:   PO intake, Supplement acceptance, Labs, Weight trends, Skin  REASON FOR ASSESSMENT:   Consult Hip fracture protocol  ASSESSMENT:   Pt admitted after a fall leading to L femur fracture. PMH significant for HTN, GERD, meningioma, peripheral neuropathy, CKD stage III.  6/9 - s/p IMN of L intertrochanteric femur  Spoke with pt and his son present at bedside. Pt's son reports that breakfast was his first meal since Saturday at 4pm. Pt reports only eating a few bites of oatmeal and a few bites of pancakes, as well as coffee.   Pt lives alone and is fairly active, as he continues to do yard work. He typically eats a meal for breakfast (including something like a sausage biscuit) and dinner and has something small for lunch. He denies difficulty chewing/swallowing foods.   Meal completions: 6/10: 50% breakfast  Pt reports that his usual weight is ~190 lbs and may fluctuate up and down about 2 lbs but has not had significant weight changes. There is limited documented weight history on file to review within the last year. Pt's weight noted to have declined from 92.2 kg in 2019 down to 83 kg this admission. Uncertain whether this was a gradual or rapid decline. Will continue to monitor throughout admission.   Pt reports irregular BM's. His last was 6/8.   Medications: colace, protonix, IV abx  Labs: sodium 132, BUN 26, GFR 56, Vitamin D 55.32 (wdl)  NUTRITION - FOCUSED PHYSICAL EXAM:  Flowsheet Row Most Recent Value  Orbital Region Mild  depletion  Upper Arm Region No depletion  Thoracic and Lumbar Region No depletion  Buccal Region No depletion  Temple Region Mild depletion  Clavicle Bone Region No depletion  Clavicle and Acromion Bone Region Mild depletion  Scapular Bone Region No depletion  Dorsal Hand No depletion  Patellar Region No depletion  Anterior Thigh Region No depletion  Posterior Calf Region No depletion  Edema (RD Assessment) None  Hair Reviewed  Eyes Reviewed  Mouth Other (Comment)  [wears partial dentures]  Skin Reviewed  Nails Reviewed       Diet Order:   Diet Order             Diet regular Room service appropriate? No; Fluid consistency: Thin  Diet effective now                   EDUCATION NEEDS:   Education needs have been addressed  Skin:  Skin Assessment: Reviewed RN Assessment (closed L hip incision)  Last BM:  6/8 per pt  Height:   Ht Readings from Last 1 Encounters:  06/02/23 5' 9.02" (1.753 m)    Weight:   Wt Readings from Last 1 Encounters:  06/02/23 83 kg   BMI:  Body mass index is 27.01 kg/m.  Estimated Nutritional Needs:   Kcal:  1850-2050  Protein:  95-110g  Fluid:  >/=1.9L  Drusilla Kanner, RDN, LDN Clinical Nutrition

## 2023-06-03 NOTE — Plan of Care (Signed)
  Problem: Education: Goal: Knowledge of General Education information will improve Description: Including pain rating scale, medication(s)/side effects and non-pharmacologic comfort measures Outcome: Progressing   Problem: Clinical Measurements: Goal: Will remain free from infection Outcome: Progressing   Problem: Activity: Goal: Risk for activity intolerance will decrease Outcome: Progressing   Problem: Coping: Goal: Level of anxiety will decrease Outcome: Progressing   Problem: Elimination: Goal: Will not experience complications related to bowel motility Outcome: Progressing   Problem: Pain Managment: Goal: General experience of comfort will improve Outcome: Progressing   Problem: Safety: Goal: Ability to remain free from injury will improve Outcome: Progressing   

## 2023-06-03 NOTE — Progress Notes (Signed)
Orthopaedic Trauma Progress Note  SUBJECTIVE: Patient doing fairly well this morning.  Just recently received pain medication.  Has not been up out of bed yet.  Patient notes difficulty with bending his knee and lifting his leg, we discussed this is normal given his injury and surgery.  Tolerating diet and fluids.  Denies any numbness or tingling throughout the left lower extremity.  No chest pain. No SOB. No nausea/vomiting. No other complaints.  Patient's son at bedside.  Patient's son would ideally like the patient to go home and have home health therapies.  The son plans to stay with patient at discharge.    OBJECTIVE:  Vitals:   06/03/23 0337 06/03/23 0735  BP: (!) 122/49 (!) 133/53  Pulse: 64 65  Resp: 20 18  Temp: 97.9 F (36.6 C) 98.5 F (36.9 C)  SpO2: 95% 97%    General: Sitting up in bed, no acute distress Respiratory: No increased work of breathing.  LLE: Dressing CDI. Tender about the hip as expected. No significant calf tenderness. Tolerates small amount of passive knee ROM, notes some discomfort with this.  Ankle DF/PF intact.  Endorses sensation throughout all aspects of foot. + EHL/FHL.  Compartments soft and compressible. +DP pulse   IMAGING: Stable post op imaging.   LABS:  Results for orders placed or performed during the hospital encounter of 06/02/23 (from the past 24 hour(s))  VITAMIN D 25 Hydroxy (Vit-D Deficiency, Fractures)     Status: None   Collection Time: 06/02/23  8:53 PM  Result Value Ref Range   Vit D, 25-Hydroxy 55.32 30 - 100 ng/mL  CBC     Status: Abnormal   Collection Time: 06/02/23  8:53 PM  Result Value Ref Range   WBC 12.7 (H) 4.0 - 10.5 K/uL   RBC 2.87 (L) 4.22 - 5.81 MIL/uL   Hemoglobin 9.6 (L) 13.0 - 17.0 g/dL   HCT 16.1 (L) 09.6 - 04.5 %   MCV 102.8 (H) 80.0 - 100.0 fL   MCH 33.4 26.0 - 34.0 pg   MCHC 32.5 30.0 - 36.0 g/dL   RDW 40.9 81.1 - 91.4 %   Platelets 268 150 - 400 K/uL   nRBC 0.0 0.0 - 0.2 %  Creatinine, serum     Status:  Abnormal   Collection Time: 06/02/23  8:53 PM  Result Value Ref Range   Creatinine, Ser 1.28 (H) 0.61 - 1.24 mg/dL   GFR, Estimated 53 (L) >60 mL/min  CBC     Status: Abnormal   Collection Time: 06/03/23  2:52 AM  Result Value Ref Range   WBC 7.9 4.0 - 10.5 K/uL   RBC 2.63 (L) 4.22 - 5.81 MIL/uL   Hemoglobin 8.7 (L) 13.0 - 17.0 g/dL   HCT 78.2 (L) 95.6 - 21.3 %   MCV 101.5 (H) 80.0 - 100.0 fL   MCH 33.1 26.0 - 34.0 pg   MCHC 32.6 30.0 - 36.0 g/dL   RDW 08.6 57.8 - 46.9 %   Platelets 256 150 - 400 K/uL   nRBC 0.0 0.0 - 0.2 %  Basic metabolic panel     Status: Abnormal   Collection Time: 06/03/23  2:52 AM  Result Value Ref Range   Sodium 132 (L) 135 - 145 mmol/L   Potassium 4.8 3.5 - 5.1 mmol/L   Chloride 100 98 - 111 mmol/L   CO2 23 22 - 32 mmol/L   Glucose, Bld 152 (H) 70 - 99 mg/dL   BUN 26 (H) 8 -  23 mg/dL   Creatinine, Ser 6.57 0.61 - 1.24 mg/dL   Calcium 8.3 (L) 8.9 - 10.3 mg/dL   GFR, Estimated 56 (L) >60 mL/min   Anion gap 9 5 - 15    ASSESSMENT: Andrew Montgomery is a 89 y.o. male, 1 Day Post-Op s/p INTRAMEDULLARY NAIL LEFT INTERTROCHANTERIC  CV/Blood loss: Acute blood loss anemia, Hgb 8.7 this AM. Hemodynamically stable  PLAN: Weightbearing: WBAT LLE ROM:  Unrestricted ROM  Incisional and dressing care: Reinforce dressings as needed  Showering: Ok to begin getting incisions wet 06/05/23 Orthopedic device(s): None  Pain management:  1. Tylenol 1000 mg q 6 hours scheduled 2. Robaxin 500 mg q 6 hours PRN 3. Oxycodone 5 mg q 4 hours PRN 4. Dilaudid 0.5 mg q 3 hours PRN 5. Neurontin 600 mg BID VTE prophylaxis: Lovenox, SCDs ID:  Ancef 2gm post op Foley/Lines:  No foley, KVO IVFs Impediments to Fracture Healing: Vit d level 55, no supplementation needed Dispo: PT/OT eval today. Dispo pending.   D/C recommendations: - Oxycodone 5mg   for pain control - Eliquis 2.5 mg BID x 30 days for DVT prophylaxis - No additional Vit D supplementation needed  Follow - up  plan: 2 weeks after discharge for wound check and repeat x-rays left hip   Contact information:  Truitt Merle MD, Thyra Breed PA-C. After hours and holidays please check [email or web address] for group call information for [facility]   Thompson Caul, PA-C [phone] (office) [email or web address]

## 2023-06-03 NOTE — Progress Notes (Signed)
Inpatient Rehab Admissions Coordinator:   Per therapy recommendations, patient was screened for CIR candidacy by Megan Salon, MS, CCC-SLP. I do not think that Pt. Has a diagnosis that payor will approve for CIR I will not pursue a rehab consult for this Pt.   Recommend other rehab venues to be pursued.  Please contact me with any questions.   Megan Salon, MS, CCC-SLP Rehab Admissions Coordinator  [phone] (celll) [phone] (office)

## 2023-06-03 NOTE — Progress Notes (Signed)
  Progress Note   Patient: Andrew Montgomery JXB:[id number] DOB: [birth year] DOA: 06/02/2023     1 DOS: the patient was seen and examined on 06/03/2023   Brief hospital course: Mr. Makin is an 89 y.o. M with HTN, meningioma, CKD IIIa baseline 1.5 who presented with fall and left hip fracture.  Patient underwent cephalomedullary nail fixation of the left intertrochanteric femur fracture today's postoperative day 1.  Patient had orthostatics positive and syncopal episode during participation with physical therapy.  Treated with IV fluid bolus.  Assessment and Plan:   1. Left hip intertrochanteric fracture fracture  -Patient had cephalomedullary nail fixation today's postoperative day 1 -Hemoglobin is stable -Patient had a syncopal episode secondary to orthostatic, treated with IV fluids - DVT prophylaxis with enoxaparin - Orthopedic surgery following.   2. Hypertension  -Hold all antihypertensives given patient is orthostatic positive treat with IV fluid bolus for endodermal.    3. CKD 3B  - SCr is 1.65 on admission, was 1.39 in March 2024  - Renally-dose medications, monitor     4. GERD  - Continue PPI     5. Neuropathy  - Continue gabapentin        DVT prophylaxis: SCDs      Subjective: Patient was seen and examined at bedside today.  Today's postoperative day 1.  Patient had no new complaints.  However the on the patient was participating with physical therapy patient had orthostatics positive and had near syncope.  Physical Exam: Vitals:   06/02/23 2055 06/02/23 2345 06/03/23 0337 06/03/23 0735  BP: (!) 122/58 (!) 123/52 (!) 122/49 (!) 133/53  Pulse: 64 66 64 65  Resp: 16 20 20 18   Temp: 97.6 F (36.4 C) 97.8 F (36.6 C) 97.9 F (36.6 C) 98.5 F (36.9 C)  TempSrc:      SpO2: 93% 95% 95% 97%  Weight:      Height:       Constitutional: NAD, calm  Eyes: PERTLA, lids and conjunctivae normal ENMT: Mucous membranes are moist. Posterior pharynx clear of any exudate or  lesions.   Neck: supple, no masses  Respiratory: no wheezing, no crackles. No accessory muscle use.  Cardiovascular: S1 & S2 heard, regular rate and rhythm. No extremity edema.   Abdomen: No distension, no tenderness, soft. Bowel sounds active.  Musculoskeletal: no clubbing / cyanosis. Left hip tender and deformed, neurovascularly intact distally.   Skin: no significant rashes, lesions, ulcers. Warm, dry, well-perfused. Neurologic: CN 2-12 grossly intact. Sensation intact. Moving all extremities. Alert and oriented.  Psychiatric: Pleasant. Cooperative. Data Reviewed:  Reviewed labs CMP, mild hyponatremia, WBC count normalized.  Family Communication: No family at bedside  Disposition: Status is: Inpatient Remains inpatient appropriate because: Status post cephalomedullary nailing of the left intertrochanteric femur fracture, orthostatic positive  Planned Discharge Destination: TBD    Time spent: 35 minutes  Author: Harold Hedge, MD 06/03/2023 1:41 PM  For on call review [email or web address].

## 2023-06-03 NOTE — Discharge Instructions (Signed)
Orthopaedic Trauma Service Discharge Instructions   General Discharge Instructions  WEIGHT BEARING STATUS:Weightbearing left lower extremity  RANGE OF MOTION/ACTIVITY: Ok for hip and knee range of motion as tolerated  Wound Care: You may remove your surgical dressing on post-op day 2, (Tuesday 06/04/23). Incisions can be left open to air if there is no drainage. Once the incision is completely dry and without drainage, it may be left open to air out.  Showering may begin post-op day 3, (Wednesday 06/05/23).  Clean incision gently with soap and water.  DVT/PE prophylaxis:  Eliquis 2.5 mg twice daily x 30 days  Diet: as you were eating previously.  Can use over the counter stool softeners and bowel preparations, such as Miralax, to help with bowel movements.  Narcotics can be constipating.  Be sure to drink plenty of fluids  PAIN MEDICATION USE AND EXPECTATIONS  You have likely been given narcotic medications to help control your pain.  After a traumatic event that results in an fracture (broken bone) with or without surgery, it is ok to use narcotic pain medications to help control one's pain.  We understand that everyone responds to pain differently and each individual patient will be evaluated on a regular basis for the continued need for narcotic medications. Ideally, narcotic medication use should last no more than 6-8 weeks (coinciding with fracture healing).   As a patient it is your responsibility as well to monitor narcotic medication use and report the amount and frequency you use these medications when you come to your office visit.   We would also advise that if you are using narcotic medications, you should take a dose prior to therapy to maximize you participation.  IF YOU ARE ON NARCOTIC MEDICATIONS IT IS NOT PERMISSIBLE TO OPERATE A MOTOR VEHICLE (MOTORCYCLE/CAR/TRUCK/MOPED) OR HEAVY MACHINERY DO NOT MIX NARCOTICS WITH OTHER CNS (CENTRAL NERVOUS SYSTEM) DEPRESSANTS SUCH AS  ALCOHOL   STOP SMOKING OR USING NICOTINE PRODUCTS!!!!  As discussed nicotine severely impairs your body's ability to heal surgical and traumatic wounds but also impairs bone healing.  Wounds and bone heal by forming microscopic blood vessels (angiogenesis) and nicotine is a vasoconstrictor (essentially, shrinks blood vessels).  Therefore, if vasoconstriction occurs to these microscopic blood vessels they essentially disappear and are unable to deliver necessary nutrients to the healing tissue.  This is one modifiable factor that you can do to dramatically increase your chances of healing your injury.    (This means no smoking, no nicotine gum, patches, etc)  DO NOT USE NONSTEROIDAL ANTI-INFLAMMATORY DRUGS (NSAID'S)  Using products such as Advil (ibuprofen), Aleve (naproxen), Motrin (ibuprofen) for additional pain control during fracture healing can delay and/or prevent the healing response.  If you would like to take over the counter (OTC) medication, Tylenol (acetaminophen) is ok.  However, some narcotic medications that are given for pain control contain acetaminophen as well. Therefore, you should not exceed more than 4000 mg of tylenol in a day if you do not have liver disease.  Also note that there are may OTC medicines, such as cold medicines and allergy medicines that my contain tylenol as well.  If you have any questions about medications and/or interactions please ask your doctor/PA or your pharmacist.      ICE AND ELEVATE INJURED/OPERATIVE EXTREMITY  Using ice and elevating the injured extremity above your heart can help with swelling and pain control.  Icing in a pulsatile fashion, such as 20 minutes on and 20 minutes off, can be followed.  Do not place ice directly on skin. Make sure there is a barrier between to skin and the ice pack.    Using frozen items such as frozen peas works well as the conform nicely to the are that needs to be iced.  USE AN ACE WRAP OR TED HOSE FOR SWELLING  CONTROL  In addition to icing and elevation, Ace wraps or TED hose are used to help limit and resolve swelling.  It is recommended to use Ace wraps or TED hose until you are informed to stop.    When using Ace Wraps start the wrapping distally (farthest away from the body) and wrap proximally (closer to the body)   Example: If you had surgery on your leg or thing and you do not have a splint on, start the ace wrap at the toes and work your way up to the thigh        If you had surgery on your upper extremity and do not have a splint on, start the ace wrap at your fingers and work your way up to the upper arm  CALL THE OFFICE WITH ANY QUESTIONS OR CONCERNS: [phone]   VISIT OUR WEBSITE FOR ADDITIONAL INFORMATION: [email or web address]    Discharge Wound Care Instructions  Do NOT apply any ointments, solutions or lotions to pin sites or surgical wounds.  These prevent needed drainage and even though solutions like hydrogen peroxide kill bacteria, they also damage cells lining the pin sites that help fight infection.  Applying lotions or ointments can keep the wounds moist and can cause them to breakdown and open up as well. This can increase the risk for infection. When in doubt call the office.  If any drainage is noted, use foam dressing - These dressing supplies should be available at local medical supply stores [facility], [facility], etc) as well as Insurance claims handler ([facility], [facility], [facility], etc)  Once the incision is completely dry and without drainage, it may be left open to air out.  Showering may begin 36-48 hours later.  Cleaning gently with soap and water.

## 2023-06-03 NOTE — Evaluation (Signed)
Physical Therapy Evaluation Patient Details Name: Andrew Montgomery MRN: [id number] DOB: April 10, 1932 Today's Date: 06/03/2023  History of Present Illness  89 y.o. male admitted 6/9 with left hip and left ankle pain after mechanical fall at home. Same day s/p INTRAMEDULLARY NAIL LEFT INTERTROCHANTERIC.  Pt with medical history significant for hypertension, GERD, meningioma, peripheral neuropathy, and CKD stage III  Clinical Impression  Patient is s/p above surgery resulting in functional limitations due to the deficits listed below (see PT Problem List). Previously very active, regularly performing house and yard work, with intermittent  use of a SPC, indicating some instability. Pt required light min assist with mobility however became orthostatic while working with PT, needing up to mod assist as he had near sycopal episode ambulating short distance in room. Required being placed in trendelenburg position to raise MAP > 60. Pt has good family support available from son. Great potential to regain independence again. May benefit from post acute rehab. Patient will benefit from intensive inpatient follow up therapy, >3 hours/day.  Will continue to progress patient as able. He is eager to return home and worked with great effort during PT evaluation. Patient will benefit from acute skilled PT to increase their independence and safety with mobility to facilitate discharge.    After pre-syncopal episode, BP in seated position was 75/40. Transferred to recliner, LEs elevated 89/39 HR 55. 5 min later 97/39 HR 53. Transferred to bed, placed in trendelenburg position 106/53 HR 58, symptoms improved.    Recommendations for follow up therapy are one component of a multi-disciplinary discharge planning process, led by the attending physician.  Recommendations may be updated based on patient status, additional functional criteria and insurance authorization.  Follow Up Recommendations       Assistance Recommended  at Discharge Frequent or constant Supervision/Assistance  Patient can return home with the following  A little help with bathing/dressing/bathroom;A lot of help with walking and/or transfers;Assistance with cooking/housework;Assist for transportation;Help with stairs or ramp for entrance    Equipment Recommendations Rolling walker (2 wheels)  Recommendations for Other Services  Rehab consult    Functional Status Assessment Patient has had a recent decline in their functional status and demonstrates the ability to make significant improvements in function in a reasonable and predictable amount of time.     Precautions / Restrictions Precautions Precautions: Fall Restrictions Weight Bearing Restrictions: Yes LLE Weight Bearing: Weight bearing as tolerated      Mobility  Bed Mobility Overal bed mobility: Needs Assistance Bed Mobility: Sit to Supine       Sit to supine: Mod assist   General bed mobility comments: Mod assist for LE support back into bed. hypotensive.    Transfers Overall transfer level: Needs assistance Equipment used: Rolling walker (2 wheels) Transfers: Sit to/from Stand, Bed to chair/wheelchair/BSC Sit to Stand: Min assist, Mod assist   Step pivot transfers: Min assist, Mod assist       General transfer comment: Min assist for initial sit to stand transfers however with hypotensive episode pt required mod assist for transitions to chair and back to bed urgently.    Ambulation/Gait Ambulation/Gait assistance: Mod assist, Min assist Gait Distance (Feet): 18 Feet Assistive device: Rolling walker (2 wheels) Gait Pattern/deviations: Step-through pattern, Decreased stride length, Decreased stance time - left, Antalgic, Trunk flexed Gait velocity: decr Gait velocity interpretation: <1.31 ft/sec, indicative of household ambulator   General Gait Details: Initially at min assist level for RW control and VC for sequencing with AD. Pt became  less responsive and  LEs buckling, having near syncopal epsiode, up to mod assist for balance before being assisted into chair urgently.  Stairs            Wheelchair Mobility    Modified Rankin (Stroke Patients Only)       Balance Overall balance assessment: Needs assistance Sitting-balance support: Feet supported Sitting balance-Leahy Scale: Fair     Standing balance support: Bilateral upper extremity supported, During functional activity Standing balance-Leahy Scale: Poor                               Pertinent Vitals/Pain Pain Assessment Pain Assessment: 0-10 Pain Score: 6  Pain Location: L hip with ambulation Pain Descriptors / Indicators: Aching, Discomfort Pain Intervention(s): Monitored during session, Repositioned    Home Living Family/patient expects to be discharged to:: Private residence Living Arrangements: Alone Available Help at Discharge: Family;Available 24 hours/day (Son willing to stay with him 24/7) Type of Home: House Home Access: Ramped entrance     Alternate Level Stairs-Number of Steps: `1 flight Home Layout: Two level;Able to live on main level with bedroom/bathroom Home Equipment: Grab bars - tub/shower;Shower seat - built in;Cane - single point;Wheelchair - Forensic psychologist (2 wheels);Cane - quad;BSC/3in1 Additional Comments: very active with house work PTA. Fell after stumbling outside when taking out trash , pt does report staggers but no true falls beside admitting one    Prior Function Prior Level of Function : Independent/Modified Independent;History of Falls (last six months)             Mobility Comments: Ambulates uses a walking stick/SPC ADLs Comments: ind     Hand Dominance   Dominant Hand: Right    Extremity/Trunk Assessment   Upper Extremity Assessment Upper Extremity Assessment: Defer to OT evaluation    Lower Extremity Assessment Lower Extremity Assessment: LLE deficits/detail LLE Deficits / Details: Pain  limited, post op guarding as expected LLE: Unable to fully assess due to pain       Communication   Communication: No difficulties  Cognition Arousal/Alertness: Awake/alert Behavior During Therapy: WFL for tasks assessed/performed Overall Cognitive Status: Within Functional Limits for tasks assessed                                          General Comments General comments (skin integrity, edema, etc.): SpO2 98% on RA at beginning of session (reapplied at end of session 2L, still 98%-100%.) After pre-syncopal episode BP in seated position was 75/40. Transfer to recliner, LEs elevated 89/39 HR 55, 5 min later 97/39 HR 53. Transfer to bed, trendelenburg position 106/53 HR 58, symptoms improved.    Exercises     Assessment/Plan    PT Assessment Patient needs continued PT services  PT Problem List Decreased strength;Decreased range of motion;Decreased activity tolerance;Decreased balance;Decreased mobility;Decreased knowledge of use of DME;Cardiopulmonary status limiting activity;Decreased knowledge of precautions;Pain       PT Treatment Interventions DME instruction;Gait training;Stair training;Functional mobility training;Therapeutic activities;Therapeutic exercise;Balance training;Neuromuscular re-education;Patient/family education;Modalities    PT Goals (Current goals can be found in the Care Plan section)  Acute Rehab PT Goals Patient Stated Goal: Do what it takes to get better PT Goal Formulation: With patient Time For Goal Achievement: 06/17/23 Potential to Achieve Goals: Good    Frequency Min 5X/week     Co-evaluation  AM-PAC PT "6 Clicks" Mobility  Outcome Measure Help needed turning from your back to your side while in a flat bed without using bedrails?: A Little Help needed moving from lying on your back to sitting on the side of a flat bed without using bedrails?: A Little Help needed moving to and from a bed to a chair  (including a wheelchair)?: A Lot Help needed standing up from a chair using your arms (e.g., wheelchair or bedside chair)?: A Lot Help needed to walk in hospital room?: A Lot Help needed climbing 3-5 steps with a railing? : Total 6 Click Score: 13    End of Session Equipment Utilized During Treatment: Gait belt;Oxygen Activity Tolerance: Treatment limited secondary to medical complications (Comment) (hypotensive) Patient left: in bed;with call bell/phone within reach;with bed alarm set;with family/visitor present;with SCD's reapplied (trendelenburg) Nurse Communication: Mobility status;Other (comment) (pre-sycopal episode - RN aware in room.) PT Visit Diagnosis: Unsteadiness on feet (R26.81);Other abnormalities of gait and mobility (R26.89);Muscle weakness (generalized) (M62.81);History of falling (Z91.81);Difficulty in walking, not elsewhere classified (R26.2);Pain Pain - Right/Left: Left Pain - part of body: Hip    Time: 1610-9604 PT Time Calculation (min) (ACUTE ONLY): 32 min   Charges:   PT Evaluation $PT Eval Moderate Complexity: 1 Mod PT Treatments $Gait Training: 8-22 mins        Kathlyn Sacramento, PT, DPT [facility]  Rehabilitation Services Physical Therapist Office: [phone] Website: [email or web address]   Berton Mount 06/03/2023, 1:44 PM

## 2023-06-03 NOTE — Evaluation (Signed)
Occupational Therapy Evaluation Patient Details Name: Andrew Montgomery MRN: [id number] DOB: [birth year] Today's Date: 06/03/2023   History of Present Illness 89 y.o. male who presents to the emergency department with left hip and left ankle pain after mechanical fall at home. Pt with medical history significant for hypertension, GERD, meningioma, peripheral neuropathy, and CKD stage III   Clinical Impression   Pt admitted for above dx, PTA patient lived alone and reports ambulating with [facility] and being ind in ADLs. Pt son willing to stay with Pt upon DC and provide 24/7 support. Pt currently needing Min A for mobility and transfers, and bADLs. Pt has decreased ability to offload LLE, pain in LLE with movement. Discussed with pt and family the need for assistance and using a BSC over the toilet for better transfers. Pt would benefit from continued acute skilled OT services to address above deficits and help transition to next level of care. Patient has the potential to reach Mod I and demos the ability to tolerate 3 hours of therapy. Pt would benefit from an intensive rehab program to help maximize functional independence. OT will make efforts to progress pt to home with [facility] services if able.     Recommendations for follow up therapy are one component of a multi-disciplinary discharge planning process, led by the attending physician.  Recommendations may be updated based on patient status, additional functional criteria and insurance authorization.   Assistance Recommended at Discharge Frequent or constant Supervision/Assistance  Patient can return home with the following A little help with walking and/or transfers;A little help with bathing/dressing/bathroom;Assistance with cooking/housework;Help with stairs or ramp for entrance    Functional Status Assessment  Patient has had a recent decline in their functional status and demonstrates the ability to make significant improvements in function in a  reasonable and predictable amount of time.  Equipment Recommendations  None recommended by OT (Pt has rec DME)    Recommendations for Other Services       Precautions / Restrictions Precautions Precautions: Fall Restrictions Weight Bearing Restrictions: Yes LLE Weight Bearing: Weight bearing as tolerated      Mobility Bed Mobility Overal bed mobility: Needs Assistance Bed Mobility: Supine to Sit     Supine to sit: Min guard     General bed mobility comments: Instructed pt to position RLE under LLE to help with lifting L leg. Pt needing Mod A to position RLE. Pt left sitting in reliner    Transfers Overall transfer level: Needs assistance Equipment used: Rolling walker (2 wheels) Transfers: Sit to/from Stand, Bed to chair/wheelchair/BSC Sit to Stand: Min assist     Step pivot transfers: Min assist            Balance Overall balance assessment: Needs assistance Sitting-balance support: Feet supported Sitting balance-Leahy Scale: Fair     Standing balance support: Bilateral upper extremity supported, During functional activity Standing balance-Leahy Scale: Poor                             ADL either performed or assessed with clinical judgement   ADL   Eating/Feeding: Independent;Sitting   Grooming: Sitting;Set up       Lower Body Bathing: Min guard;Sitting/lateral leans       Lower Body Dressing: Min guard;Sitting/lateral leans Lower Body Dressing Details (indicate cue type and reason): and increased effort Toilet Transfer: Minimal assistance           Functional mobility during ADLs: Minimal  assistance;Rolling walker (2 wheels)       Vision         Perception     Praxis      Pertinent Vitals/Pain Pain Assessment Pain Assessment: 0-10 Pain Score: 10-Worst pain ever Pain Location: L hip with ambulation Pain Descriptors / Indicators: Aching, Discomfort Pain Intervention(s): Monitored during session, Repositioned      Hand Dominance Right   Extremity/Trunk Assessment Upper Extremity Assessment Upper Extremity Assessment: Overall WFL for tasks assessed   Lower Extremity Assessment Lower Extremity Assessment: Defer to PT evaluation       Communication Communication Communication: No difficulties   Cognition Arousal/Alertness: Awake/alert Behavior During Therapy: WFL for tasks assessed/performed Overall Cognitive Status: Within Functional Limits for tasks assessed                                       General Comments  VSS On RA. Pt denies any dizziness/light headedness. C/o pain only with mobility    Exercises     Shoulder Instructions      Home Living Family/patient expects to be discharged to:: Private residence Living Arrangements: Alone Available Help at Discharge: Family;Available 24 hours/day (Son willing to stay with him 24/7) Type of Home: House Home Access: Ramped entrance     Home Layout: Two level;Able to live on main level with bedroom/bathroom Alternate Level Stairs-Number of Steps: `1 flight   Bathroom Shower/Tub: Producer, television/film/video: Handicapped height Bathroom Accessibility: Yes How Accessible: Accessible via walker Home Equipment: Grab bars - tub/shower;Shower seat - built in;Cane - single point;Wheelchair - Forensic psychologist (2 wheels);Cane - quad;BSC/3in1   Additional Comments: very active with house work PTA. Fell after stumbling outside when taking out trash , pt does report staggers but no true falls beside admitting one      Prior Functioning/Environment Prior Level of Function : Independent/Modified Independent;History of Falls (last six months)             Mobility Comments: Ambulates uses a walking stick/SPC ADLs Comments: ind        OT Problem List: Impaired balance (sitting and/or standing);Decreased activity tolerance;Decreased knowledge of use of DME or AE;Pain      OT Treatment/Interventions:  Self-care/ADL training;Therapeutic exercise;Therapeutic activities;DME and/or AE instruction;Patient/family education;Balance training    OT Goals(Current goals can be found in the care plan section) Acute Rehab OT Goals Patient Stated Goal: To go home OT Goal Formulation: With patient/family Time For Goal Achievement: 06/17/23 Potential to Achieve Goals: Good ADL Goals Pt Will Perform Grooming: standing;with supervision Pt Will Perform Lower Body Bathing: sitting/lateral leans;with supervision Pt Will Perform Lower Body Dressing: with supervision;sitting/lateral leans Pt Will Transfer to Toilet: ambulating;with min guard assist  OT Frequency: Min 3X/week    Co-evaluation              AM-PAC OT "6 Clicks" Daily Activity     Outcome Measure Help from another person eating meals?: None Help from another person taking care of personal grooming?: A Little Help from another person toileting, which includes using toliet, bedpan, or urinal?: A Little Help from another person bathing (including washing, rinsing, drying)?: A Little Help from another person to put on and taking off regular upper body clothing?: None Help from another person to put on and taking off regular lower body clothing?: A Little 6 Click Score: 20   End of Session Equipment Utilized During Treatment:  Gait belt;Rolling walker (2 wheels) Nurse Communication: Mobility status  Activity Tolerance: Patient tolerated treatment well Patient left: in chair;with call bell/phone within reach;with chair alarm set;with family/visitor present  OT Visit Diagnosis: Unsteadiness on feet (R26.81);History of falling (Z91.81);Pain Pain - Right/Left: Left Pain - part of body: Hip                Time: 1610-9604 OT Time Calculation (min): 35 min Charges:  OT General Charges $OT Visit: 1 Visit OT Evaluation $OT Eval Moderate Complexity: 1 Mod OT Treatments $Therapeutic Activity: 8-22 mins  06/03/2023  AB, OTR/L  Acute  Rehabilitation Services  Office: [phone]   Tristan Schroeder 06/03/2023, 1:19 PM

## 2023-06-03 NOTE — TOC CM/SW Note (Addendum)
Transition of Care [facility]) - Inpatient Brief Assessment   Patient Details  Name: Andrew Montgomery MRN: [id number] Date of Birth: 14-May-1932  Transition of Care [facility]) CM/SW Contact:    Epifanio Lesches, RN Phone Number: 06/03/2023, 1:14 PM   Clinical Narrative: Pt s/p fall. Suffered a left intertrochanteric femur fracture.  From home alone. Supportive son, Earvin Hansen.      - Underwent cephalomedullary nailing of left intertrochanteric femur fracture, 6/9 Confirmed PCP, noted on AVS.  Son states hoping pt transition to home. Pt agreeable to home health services if needed. Pt without provider preference. Referral made with [facility] pending MD's orders. Pt resides in a single level home with a ramp.   PT/OT evaluations pending...  TOC  team following for needs.  Transition of Care Asessment: Insurance and Status: Insurance coverage has been reviewed Patient has primary care physician: Yes Home environment has been reviewed: From home alone Prior level of function:: independent with ADL's , used cane intermittently Prior/Current Home Services: No current home services Social Determinants of Health Reivew: SDOH reviewed no interventions necessary Readmission risk has been reviewed: No Transition of care needs: transition of care needs identified, TOC will continue to follow

## 2023-06-04 DIAGNOSIS — S72002A Fracture of unspecified part of neck of left femur, initial encounter for closed fracture: Secondary | ICD-10-CM | POA: Diagnosis not present

## 2023-06-04 LAB — CBC WITH DIFFERENTIAL/PLATELET
Abs Immature Granulocytes: 0.02 10*3/uL (ref 0.00–0.07)
Basophils Absolute: 0 10*3/uL (ref 0.0–0.1)
Basophils Relative: 0 %
Eosinophils Absolute: 0.1 10*3/uL (ref 0.0–0.5)
Eosinophils Relative: 1 %
HCT: 21.8 % — ABNORMAL LOW (ref 39.0–52.0)
Hemoglobin: 7.3 g/dL — ABNORMAL LOW (ref 13.0–17.0)
Immature Granulocytes: 0 %
Lymphocytes Relative: 23 %
Lymphs Abs: 1.6 10*3/uL (ref 0.7–4.0)
MCH: 33.8 pg (ref 26.0–34.0)
MCHC: 33.5 g/dL (ref 30.0–36.0)
MCV: 100.9 fL — ABNORMAL HIGH (ref 80.0–100.0)
Monocytes Absolute: 0.8 10*3/uL (ref 0.1–1.0)
Monocytes Relative: 12 %
Neutro Abs: 4.6 10*3/uL (ref 1.7–7.7)
Neutrophils Relative %: 64 %
Platelets: 220 10*3/uL (ref 150–400)
RBC: 2.16 MIL/uL — ABNORMAL LOW (ref 4.22–5.81)
RDW: 13.1 % (ref 11.5–15.5)
WBC: 7.2 10*3/uL (ref 4.0–10.5)
nRBC: 0 % (ref 0.0–0.2)

## 2023-06-04 LAB — COMPREHENSIVE METABOLIC PANEL
ALT: 8 U/L (ref 0–44)
AST: 20 U/L (ref 15–41)
Albumin: 2.8 g/dL — ABNORMAL LOW (ref 3.5–5.0)
Alkaline Phosphatase: 38 U/L (ref 38–126)
Anion gap: 8 (ref 5–15)
BUN: 31 mg/dL — ABNORMAL HIGH (ref 8–23)
CO2: 26 mmol/L (ref 22–32)
Calcium: 8.4 mg/dL — ABNORMAL LOW (ref 8.9–10.3)
Chloride: 98 mmol/L (ref 98–111)
Creatinine, Ser: 1.4 mg/dL — ABNORMAL HIGH (ref 0.61–1.24)
GFR, Estimated: 48 mL/min — ABNORMAL LOW (ref >60)
Glucose, Bld: 114 mg/dL — ABNORMAL HIGH (ref 70–99)
Potassium: 4.5 mmol/L (ref 3.5–5.1)
Sodium: 132 mmol/L — ABNORMAL LOW (ref 135–145)
Total Bilirubin: 0.3 mg/dL (ref 0.3–1.2)
Total Protein: 5 g/dL — ABNORMAL LOW (ref 6.5–8.1)

## 2023-06-04 MED ORDER — BISACODYL 10 MG RE SUPP
10.0000 mg | Freq: Once | RECTAL | Status: AC
  Administered 2023-06-04: 10 mg via RECTAL
  Filled 2023-06-04: qty 1

## 2023-06-04 NOTE — Plan of Care (Signed)

## 2023-06-04 NOTE — Progress Notes (Signed)
    Durable Medical Equipment  (From admission, onward)           Start     Ordered   06/04/23 1034  For home use only DME Walker rolling  Once       Question Answer Comment  Walker: With 5 Inch Wheels   Patient needs a walker to treat with the following condition Weakness      06/04/23 1034

## 2023-06-04 NOTE — TOC Initial Note (Signed)
Transition of Care [facility]) - Initial/Assessment Note    Patient Details  Name: Andrew Montgomery MRN: [id number] Date of Birth: [birth year]  [facility]) CM/SW Contact:    Lawerance Sabal, RN Phone Number: 06/04/2023, 10:36 AM  Clinical Narrative:                  Sherron Monday w patient and son at bedside.  They state they have all need DME at home, except a 2 wheeled RW.  I have ordered this through [employer] to be delivered to the room (note needs cosigned by MD)  [employer] [facility] set up with [facility], Please place [facility] order with face to face.   Expected Discharge Plan: Home w Home Health Services Barriers to Discharge: Continued Medical Work up   Patient Goals and CMS Choice Patient states their goals for this hospitalization and ongoing recovery are:: to go home CMS Medicare.gov Compare Post Acute Care list provided to:: Patient Choice offered to / list presented to : Patient      Expected Discharge Plan and Services   Discharge Planning Services: CM Consult   Living arrangements for the past 2 months: Single Family Home                 DME Arranged: Walker rolling DME Agency: [employer] Date DME Agency Contacted: 06/04/23 Time DME Agency Contacted: [phone] Representative spoke with at DME Agency: [facility] Agency: [facility]        Prior Living Arrangements/Services Living arrangements for the past 2 months: Single Family Home Lives with:: Adult Children   Do you feel safe going back to the place where you live?: Yes               Activities of Daily Living Home Assistive Devices/Equipment: Other (Comment) (walking stick) ADL Screening (condition at time of admission) Patient's cognitive ability adequate to safely complete daily activities?: Yes Is the patient deaf or have difficulty hearing?: No Does the patient have difficulty seeing, even when wearing glasses/contacts?: No Does the patient have difficulty concentrating, remembering, or making decisions?:  No Patient able to express need for assistance with ADLs?: Yes Does the patient have difficulty dressing or bathing?: No Independently performs ADLs?: Yes (appropriate for developmental age) Does the patient have difficulty walking or climbing stairs?: Yes Weakness of Legs: Both Weakness of Arms/Hands: None  Permission Sought/Granted                  Emotional Assessment              Admission diagnosis:  Closed displaced intertrochanteric fracture of left femur, initial encounter (HCC) [S72.142A] Closed left hip fracture, initial encounter (HCC) [S72.002A] Patient Active Problem List   Diagnosis Date Noted   Closed left hip fracture, initial encounter (HCC) 06/02/2023   CKD stage 3b, GFR 30-44 ml/min (HCC) 06/02/2023   BMI 27.0-27.9,adult 03/18/2023   Gastroesophageal reflux disease 03/18/2023   Seasonal allergies 03/18/2023   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) 03/18/2023   Compression fracture of lumbar vertebra with routine healing 03/18/2023   Vitamin D deficiency 03/18/2023   Vitamin B12 deficiency 03/18/2023   Peripheral polyneuropathy 03/18/2023   Age-related osteoporosis with current pathological fracture 03/18/2023   Essential hypertension 03/18/2023   Meningioma (HCC) 09/02/2018   DJD (degenerative joint disease) 01/06/2014   Depression 07/06/2013   Pain in both feet 02/19/2012   Insomnia 07/03/2011   Preventative health care 07/01/2011   HYPERTENSION 01/05/2010  ALLERGIC RHINITIS 01/05/2010   BENIGN PROSTATIC HYPERTROPHY 01/05/2010   PSA, INCREASED 01/05/2010   COLONIC POLYPS, HX OF 01/05/2010   PCP:  Crist Fat, MD Pharmacy:   [address] [facility] - [place], [place] - [address] [place] [address] Phone: [phone] Fax: [phone]  [facility] - [place], [place] - [address] [place] [address] Phone:  [phone] Fax: [phone]  [facility] - [place], [place] - [address] [place] [address] Phone: [phone] Fax: [phone]     Social Determinants of Health (SDOH) Social History: SDOH Screenings   Food Insecurity: No Food Insecurity (06/02/2023)  Housing: Low Risk  (06/02/2023)  Transportation Needs: No Transportation Needs (06/02/2023)  Utilities: Not At Risk (06/02/2023)  Tobacco Use: Medium Risk (06/03/2023)   SDOH Interventions:     Readmission Risk Interventions     No data to display

## 2023-06-04 NOTE — Plan of Care (Signed)
  Problem: Activity: Goal: Risk for activity intolerance will decrease Outcome: Progressing   Problem: Nutrition: Goal: Adequate nutrition will be maintained Outcome: Progressing   Problem: Pain Managment: Goal: General experience of comfort will improve Outcome: Progressing   

## 2023-06-04 NOTE — Progress Notes (Signed)
Orthopaedic Trauma Progress Note  SUBJECTIVE: Patient doing fairly well this morning.  Pain manageable.  Tolerating diet and fluids.  Denies any numbness or tingling throughout the left lower extremity.  No chest pain. No SOB. No nausea/vomiting. No other complaints.  Patient's son at bedside.  Patient's son would ideally like the patient to go home and have home health therapies.  The son plans to stay with patient at discharge.    OBJECTIVE:  Vitals:   06/03/23 2133 06/04/23 0528  BP: 121/84 (!) 134/51  Pulse: 67 64  Resp: 18 18  Temp: 98 F (36.7 C) 97.6 F (36.4 C)  SpO2: 95% 96%    General: Sitting up in bed, no acute distress Respiratory: No increased work of breathing.  LLE: Dressing removed, incisions appear stable but there was some bloody drainage noted on bandage. Tender about the hip as expected. No significant calf tenderness. Tolerates small amount of passive knee ROM, notes some discomfort with this.  Ankle DF/PF intact.  Endorses sensation throughout all aspects of foot. + EHL/FHL.  Compartments soft and compressible. +DP pulse   IMAGING: Stable post op imaging.   LABS:  Results for orders placed or performed during the hospital encounter of 06/02/23 (from the past 24 hour(s))  CBC with Differential/Platelet     Status: Abnormal   Collection Time: 06/04/23  2:26 AM  Result Value Ref Range   WBC 7.2 4.0 - 10.5 K/uL   RBC 2.16 (L) 4.22 - 5.81 MIL/uL   Hemoglobin 7.3 (L) 13.0 - 17.0 g/dL   HCT 16.1 (L) 09.6 - 04.5 %   MCV 100.9 (H) 80.0 - 100.0 fL   MCH 33.8 26.0 - 34.0 pg   MCHC 33.5 30.0 - 36.0 g/dL   RDW 40.9 81.1 - 91.4 %   Platelets 220 150 - 400 K/uL   nRBC 0.0 0.0 - 0.2 %   Neutrophils Relative % 64 %   Neutro Abs 4.6 1.7 - 7.7 K/uL   Lymphocytes Relative 23 %   Lymphs Abs 1.6 0.7 - 4.0 K/uL   Monocytes Relative 12 %   Monocytes Absolute 0.8 0.1 - 1.0 K/uL   Eosinophils Relative 1 %   Eosinophils Absolute 0.1 0.0 - 0.5 K/uL   Basophils Relative 0 %    Basophils Absolute 0.0 0.0 - 0.1 K/uL   Immature Granulocytes 0 %   Abs Immature Granulocytes 0.02 0.00 - 0.07 K/uL  Comprehensive metabolic panel     Status: Abnormal   Collection Time: 06/04/23  2:26 AM  Result Value Ref Range   Sodium 132 (L) 135 - 145 mmol/L   Potassium 4.5 3.5 - 5.1 mmol/L   Chloride 98 98 - 111 mmol/L   CO2 26 22 - 32 mmol/L   Glucose, Bld 114 (H) 70 - 99 mg/dL   BUN 31 (H) 8 - 23 mg/dL   Creatinine, Ser 7.82 (H) 0.61 - 1.24 mg/dL   Calcium 8.4 (L) 8.9 - 10.3 mg/dL   Total Protein 5.0 (L) 6.5 - 8.1 g/dL   Albumin 2.8 (L) 3.5 - 5.0 g/dL   AST 20 15 - 41 U/L   ALT 8 0 - 44 U/L   Alkaline Phosphatase 38 38 - 126 U/L   Total Bilirubin 0.3 0.3 - 1.2 mg/dL   GFR, Estimated 48 (L) >60 mL/min   Anion gap 8 5 - 15    ASSESSMENT: Andrew Montgomery is a 89 y.o. male, 2 Days Post-Op s/p INTRAMEDULLARY NAIL LEFT INTERTROCHANTERIC  CV/Blood loss: Acute blood loss anemia, Hgb 7.3 this AM. Hemodynamically stable  PLAN: Weightbearing: WBAT LLE ROM:  Unrestricted ROM  Incisional and dressing care: Change Mepilex PRN Showering: Ok to begin getting incisions wet 06/05/23 Orthopedic device(s): None  Pain management:  1. Tylenol 1000 mg q 6 hours scheduled 2. Robaxin 500 mg q 6 hours PRN 3. Oxycodone 5 mg q 4 hours PRN 4. Dilaudid 0.5 mg q 3 hours PRN 5. Neurontin 600 mg BID VTE prophylaxis: Lovenox, SCDs ID:  Ancef 2gm post op completed Foley/Lines:  No foley, KVO IVFs Impediments to Fracture Healing: Vit d level 55, no supplementation needed Dispo: PT/OT eval ongoing, initially recommending CIR but per CIR coordinator notes patient does not have a diagnosis that payor will approve. Patient/son would ideally prefer HH versus going to a SNF. Continue to monitor CBC.   D/C recommendations: - Oxycodone 5mg   for pain control - Eliquis 2.5 mg BID x 30 days for DVT prophylaxis - No additional Vit D supplementation needed  Follow - up plan: 2 weeks after discharge for wound  check and repeat x-rays left hip   Contact information:  Truitt Merle MD, Thyra Breed PA-C. After hours and holidays please check [email or web address] for group call information for [facility]   Thompson Caul, PA-C [phone] (office) [email or web address]

## 2023-06-04 NOTE — Progress Notes (Signed)
  Progress Note   Patient: Andrew Montgomery WUJ:[id number] DOB: [birth year] DOA: 06/02/2023     2 DOS: the patient was seen and examined on 06/04/2023   Brief hospital course: Mr. Montefusco is an 89 y.o. M with HTN, meningioma, CKD IIIa baseline 1.5 who presented with fall and left hip fracture.  Patient underwent cephalomedullary nail fixation of the left intertrochanteric femur fracture today's postoperative day 1.  Patient had orthostatics positive and syncopal episode during participation with physical therapy.  Treated with IV fluid bolus.  Assessment and Plan:   1. Left hip intertrochanteric fracture fracture  -Patient had cephalomedullary nail fixation today's postoperative day 2 -Hemoglobin has dropped from 8.7 to 7.3. Monitor -Patient had a syncopal episode secondary to orthostatic, treated with IV fluids - DVT prophylaxis with enoxaparin - Orthopedic surgery following.   2. Hypertension  -Hold all antihypertensives given patient is orthostatic positive treat with IV fluid bolus for endodermal.   - BP Fairly stable.  3. CKD 3B  - SCr is 1.65 on admission, was 1.39 in March 2024  - Renally-dose medications, monitor     4. GERD  - Continue PPI     5. Neuropathy  - Continue gabapentin        DVT prophylaxis: SCDs      Subjective: Patient was seen and examined at bedside today.  Today's postoperative day 2.  Patient had no new complaints.  Hemoglobin has dropped with blood pressures fairly stable.  No overt bleeding Physical Exam: Vitals:   06/03/23 2133 06/04/23 0528 06/04/23 1029 06/04/23 1305  BP: 121/84 (!) 134/51 (!) 128/39 (!) 131/52  Pulse: 67 64 62 60  Resp: 18 18  18   Temp: 98 F (36.7 C) 97.6 F (36.4 C)  97.6 F (36.4 C)  TempSrc: Oral Oral  Oral  SpO2: 95% 96%  96%  Weight:      Height:       Constitutional: NAD, calm  Eyes: PERTLA, lids and conjunctivae normal ENMT: Mucous membranes are moist. Posterior pharynx clear of any exudate or lesions.   Neck:  supple, no masses  Respiratory: no wheezing, no crackles. No accessory muscle use.  Cardiovascular: S1 & S2 heard, regular rate and rhythm. No extremity edema.   Abdomen: No distension, no tenderness, soft. Bowel sounds active.  Musculoskeletal: no clubbing / cyanosis. Left hip tender and deformed, neurovascularly intact distally.   Skin: no significant rashes, lesions, ulcers. Warm, dry, well-perfused. Neurologic: CN 2-12 grossly intact. Sensation intact. Moving all extremities. Alert and oriented.  Psychiatric: Pleasant. Cooperative. Data Reviewed:  Reviewed labs CMP, mild hyponatremia, WBC count normalized.  Family Communication: No family at bedside  Disposition: Status is: Inpatient Remains inpatient appropriate because: Status post cephalomedullary nailing of the left intertrochanteric femur fracture, orthostatic positive  Planned Discharge Destination: TBD    Time spent: 35 minutes  Author: Harold Hedge, MD 06/04/2023 1:26 PM  For on call review [email or web address].

## 2023-06-04 NOTE — Progress Notes (Signed)
Physical Therapy Treatment Patient Details Name: Andrew Montgomery MRN: [id number] DOB: 10-06-1932 Today's Date: 06/04/2023   History of Present Illness 89 y.o. male admitted 6/9 with left hip and left ankle pain after mechanical fall at home. Same day s/p INTRAMEDULLARY NAIL LEFT INTERTROCHANTERIC.  Pt with medical history significant for hypertension, GERD, meningioma, peripheral neuropathy, and CKD stage III    PT Comments    Good progress this afternoon. Ambulating up to 45 feet with RW at a min guard level, no evidence of buckling. Reviewed RW use and support to unload LLE as needed. Reviewed LE exercises. Pt still a bit hypotensive upon standing but seemed to remain steady through duration of session without further drop, and remained asymptomatic. Anticipate HHPT will be adequate at d/c with son's supervision, once medially stable. Patient will continue to benefit from skilled physical therapy services to further improve independence with functional mobility.    06/04/23 1500  Orthostatic Lying   BP- Lying 132/51  Pulse- Lying 62  Orthostatic Sitting  BP- Sitting 128/55  Pulse- Sitting 66  Orthostatic Standing at 0 minutes  BP- Standing at 0 minutes 102/43  Pulse- Standing at 0 minutes 63  Orthostatic Standing at 3 minutes  BP- Standing at 3 minutes 102/59  Pulse- Standing at 3 minutes 56      Recommendations for follow up therapy are one component of a multi-disciplinary discharge planning process, led by the attending physician.  Recommendations may be updated based on patient status, additional functional criteria and insurance authorization.  Follow Up Recommendations       Assistance Recommended at Discharge Intermittent Supervision/Assistance  Patient can return home with the following A little help with bathing/dressing/bathroom;Assistance with cooking/housework;Assist for transportation;Help with stairs or ramp for entrance;A little help with walking and/or transfers    Equipment Recommendations  Rolling walker (2 wheels)    Recommendations for Other Services       Precautions / Restrictions Precautions Precautions: Fall Restrictions Weight Bearing Restrictions: Yes LLE Weight Bearing: Weight bearing as tolerated     Mobility  Bed Mobility Overal bed mobility: Needs Assistance Bed Mobility: Supine to Sit     Supine to sit: Min assist     General bed mobility comments: Min assist for LLE support out of bed, and to pull gently through therapist hand to rise from flat surface.    Transfers Overall transfer level: Needs assistance Equipment used: Rolling walker (2 wheels) Transfers: Sit to/from Stand Sit to Stand: Min guard           General transfer comment: Min guard for safety performed from low bed setting and recliner wtihout physical assist. Cues for hand placement.    Ambulation/Gait Ambulation/Gait assistance: Min guard Gait Distance (Feet): 45 Feet Assistive device: Rolling walker (2 wheels) Gait Pattern/deviations: Step-through pattern, Decreased stride length, Decreased stance time - left, Antalgic, Trunk flexed, Step-to pattern Gait velocity: decr Gait velocity interpretation: <1.31 ft/sec, indicative of household ambulator   General Gait Details: Cues for UE use to unload LLE as needed for pain management with gait. Educated on AD use and placement for sequencing, symptom awareness, and required 2 standing rest breaks to complete distance. Dinies dizziness this bout.   Stairs             Wheelchair Mobility    Modified Rankin (Stroke Patients Only)       Balance Overall balance assessment: Needs assistance Sitting-balance support: Feet supported Sitting balance-Leahy Scale: Fair     Standing balance support:  During functional activity, Single extremity supported Standing balance-Leahy Scale: Poor                              Cognition Arousal/Alertness: Awake/alert Behavior During  Therapy: WFL for tasks assessed/performed Overall Cognitive Status: Within Functional Limits for tasks assessed                                          Exercises General Exercises - Lower Extremity Ankle Circles/Pumps: AROM, Both, 10 reps, Supine Quad Sets: Strengthening, Both, 10 reps, Supine Gluteal Sets: Strengthening, Both, 10 reps, Seated Long Arc Quad: Strengthening, Both, 5 reps, Seated    General Comments General comments (skin integrity, edema, etc.): BP 102/59 HR 56 while sitting immediately following ambulatory bout, denies dizziness.      Pertinent Vitals/Pain Pain Assessment Pain Assessment: 0-10 Pain Score: 10-Worst pain ever Pain Location: L hip with ambulation Pain Descriptors / Indicators: Aching, Discomfort Pain Intervention(s): Monitored during session, Repositioned, Limited activity within patient's tolerance (Declined call for pain med at this time.)    Home Living                          Prior Function            PT Goals (current goals can now be found in the care plan section) Acute Rehab PT Goals Patient Stated Goal: Do what it takes to get better PT Goal Formulation: With patient Time For Goal Achievement: 06/17/23 Potential to Achieve Goals: Good Progress towards PT goals: Progressing toward goals    Frequency    Min 5X/week      PT Plan Discharge plan needs to be updated    Co-evaluation              AM-PAC PT "6 Clicks" Mobility   Outcome Measure  Help needed turning from your back to your side while in a flat bed without using bedrails?: A Little Help needed moving from lying on your back to sitting on the side of a flat bed without using bedrails?: A Little Help needed moving to and from a bed to a chair (including a wheelchair)?: A Little Help needed standing up from a chair using your arms (e.g., wheelchair or bedside chair)?: A Little Help needed to walk in hospital room?: A Little Help  needed climbing 3-5 steps with a railing? : A Lot 6 Click Score: 17    End of Session Equipment Utilized During Treatment: Gait belt Activity Tolerance: Patient tolerated treatment well Patient left: with call bell/phone within reach;with family/visitor present;with SCD's reapplied;in chair;with chair alarm set Nurse Communication: Mobility status PT Visit Diagnosis: Unsteadiness on feet (R26.81);Other abnormalities of gait and mobility (R26.89);Muscle weakness (generalized) (M62.81);History of falling (Z91.81);Difficulty in walking, not elsewhere classified (R26.2);Pain Pain - Right/Left: Left Pain - part of body: Hip     Time: 1610-9604 PT Time Calculation (min) (ACUTE ONLY): 31 min  Charges:  $Gait Training: 8-22 mins $Therapeutic Activity: 8-22 mins                     Kathlyn [place], PT, DPT [facility]  Rehabilitation Services Physical Therapist Office: [phone] Website: [email or web address]    Berton Mount 06/04/2023, 5:04 PM

## 2023-06-04 NOTE — TOC CAGE-AID Note (Signed)
Transition of Care [facility]) - CAGE-AID Screening   Patient Details  Name: Andrew Montgomery MRN: [id number] Date of Birth: 04/06/1932  [facility]) CM/SW Contact:    Leota Sauers, RN Phone Number: 06/04/2023, 7:01 PM   Clinical Narrative:  Patient reports alcohol use, denies illicit drug use. Education not offered at this time.  CAGE-AID Screening:    Have You Ever Felt You Ought to Cut Down on Your Drinking or Drug Use?: No Have People Annoyed You By Critizing Your Drinking Or Drug Use?: No Have You Felt Bad Or Guilty About Your Drinking Or Drug Use?: No Have You Ever Had a Drink or Used Drugs First Thing In The Morning to Steady Your Nerves or to Get Rid of a Hangover?: No CAGE-AID Score: 0  Substance Abuse Education Offered: No

## 2023-06-04 NOTE — Progress Notes (Signed)
Occupational Therapy Treatment Patient Details Name: Andrew Montgomery MRN: [id number] DOB: September 15, 1932 Today's Date: 06/04/2023   History of present illness 89 y.o. male admitted 6/9 with left hip and left ankle pain after mechanical fall at home. Same day s/p INTRAMEDULLARY NAIL LEFT INTERTROCHANTERIC.  Pt with medical history significant for hypertension, GERD, meningioma, peripheral neuropathy, and CKD stage III   OT comments  Pt continuing to progress towards pt focused goals. Pt ambulating and complete STS with min guard assist, but needing more assist with STS from low surfaces. Instructed pt to place [facility] over toilet and using shower chair upon DC for safety and successful transfers. Pt completing lower body dressing with min guard and increased effort. Educated pt on the use of gait belt as leg lifter for bed mobility, also educated pt's son on the use  of gait belt and the level of assist provided as well as proper hand placement for gait belt use. OT educating and guiding son through trial with STS technique and pt's son performed it successfully. OT to continue to progress pt as able, pt progressing and DC plans updated to [facility].    Recommendations for follow up therapy are one component of a multi-disciplinary discharge planning process, led by the attending physician.  Recommendations may be updated based on patient status, additional functional criteria and insurance authorization.    Assistance Recommended at Discharge Intermittent Supervision/Assistance  Patient can return home with the following  A little help with walking and/or transfers;A little help with bathing/dressing/bathroom;Assistance with cooking/housework;Help with stairs or ramp for entrance   Equipment Recommendations  None recommended by OT (Pt has rec DME)    Recommendations for Other Services      Precautions / Restrictions Precautions Precautions: Fall Precaution Comments: watch BP Restrictions Weight Bearing  Restrictions: Yes LLE Weight Bearing: Weight bearing as tolerated       Mobility Bed Mobility Overal bed mobility: Needs Assistance Bed Mobility: Sit to Supine       Sit to supine: Min assist   General bed mobility comments: Pt rec'd sitting in recliner. Pt needing MIn A to help with LLE, educated pt on the use of gait belt as leg lifter to help with bed mobility. Pt verbalized and return demonstrated understanding.    Transfers Overall transfer level: Needs assistance Equipment used: Rolling walker (2 wheels) Transfers: Sit to/from Stand Sit to Stand: Min guard           General transfer comment: Mod A STS from low toilet, educated pt one extending LLE when sitting to help reduce hip pain     Balance Overall balance assessment: Needs assistance Sitting-balance support: Feet supported Sitting balance-Leahy Scale: Fair     Standing balance support: During functional activity, Single extremity supported Standing balance-Leahy Scale: Poor                             ADL either performed or assessed with clinical judgement   ADL Overall ADL's : Needs assistance/impaired                     Lower Body Dressing: Min guard;Sitting/lateral leans Lower Body Dressing Details (indicate cue type and reason): and increased effort, doffed/donned socks Toilet Transfer: Min guard;Ambulation;Rolling walker (2 wheels) Toilet Transfer Details (indicate cue type and reason): STS mod A from low toilet Toileting- Clothing Manipulation and Hygiene: Min guard;Sit to/from stand       Functional mobility  during ADLs: Min guard;Rolling walker (2 wheels)      Extremity/Trunk Assessment              Vision       Perception     Praxis      Cognition Arousal/Alertness: Awake/alert Behavior During Therapy: WFL for tasks assessed/performed Overall Cognitive Status: Within Functional Limits for tasks assessed                                           Exercises      Shoulder Instructions       General Comments Pt without c/o dizziness during session. BP at end of session 135/49    Pertinent Vitals/ Pain       Pain Assessment Pain Assessment: Faces Faces Pain Scale: Hurts little more Pain Location: L hip with ambulation Pain Descriptors / Indicators: Aching, Discomfort Pain Intervention(s): Monitored during session, Repositioned  Home Living                                          Prior Functioning/Environment              Frequency  Min 3X/week        Progress Toward Goals  OT Goals(current goals can now be found in the care plan section)  Progress towards OT goals: Progressing toward goals  Acute Rehab OT Goals Patient Stated Goal: To go home OT Goal Formulation: With patient/family Time For Goal Achievement: 06/17/23 Potential to Achieve Goals: Good  Plan Discharge plan needs to be updated;Frequency remains appropriate    Co-evaluation                 AM-PAC OT "6 Clicks" Daily Activity     Outcome Measure   Help from another person eating meals?: None Help from another person taking care of personal grooming?: A Little Help from another person toileting, which includes using toliet, bedpan, or urinal?: A Little Help from another person bathing (including washing, rinsing, drying)?: A Little Help from another person to put on and taking off regular upper body clothing?: None Help from another person to put on and taking off regular lower body clothing?: A Little 6 Click Score: 20    End of Session Equipment Utilized During Treatment: Gait belt;Rolling walker (2 wheels)  OT Visit Diagnosis: Unsteadiness on feet (R26.81);History of falling (Z91.81);Pain Pain - Right/Left: Left Pain - part of body: Hip   Activity Tolerance Patient tolerated treatment well   Patient Left in bed;with call bell/phone within reach;with family/visitor present   Nurse  Communication Mobility status        Time: 1610-9604 OT Time Calculation (min): 31 min  Charges: OT General Charges $OT Visit: 1 Visit OT Treatments $Self Care/Home Management : 8-22 mins $Therapeutic Activity: 8-22 mins  06/04/2023  AB, OTR/L  Acute Rehabilitation Services  Office: [phone]   Tristan Schroeder 06/04/2023, 5:57 PM

## 2023-06-05 DIAGNOSIS — N1831 Chronic kidney disease, stage 3a: Secondary | ICD-10-CM | POA: Diagnosis not present

## 2023-06-05 DIAGNOSIS — D62 Acute posthemorrhagic anemia: Secondary | ICD-10-CM

## 2023-06-05 DIAGNOSIS — S72002A Fracture of unspecified part of neck of left femur, initial encounter for closed fracture: Secondary | ICD-10-CM | POA: Diagnosis not present

## 2023-06-05 LAB — COMPREHENSIVE METABOLIC PANEL
ALT: 9 U/L (ref 0–44)
AST: 22 U/L (ref 15–41)
Albumin: 2.7 g/dL — ABNORMAL LOW (ref 3.5–5.0)
Alkaline Phosphatase: 39 U/L (ref 38–126)
Anion gap: 10 (ref 5–15)
BUN: 30 mg/dL — ABNORMAL HIGH (ref 8–23)
CO2: 27 mmol/L (ref 22–32)
Calcium: 8.3 mg/dL — ABNORMAL LOW (ref 8.9–10.3)
Chloride: 96 mmol/L — ABNORMAL LOW (ref 98–111)
Creatinine, Ser: 1.28 mg/dL — ABNORMAL HIGH (ref 0.61–1.24)
GFR, Estimated: 53 mL/min — ABNORMAL LOW (ref >60)
Glucose, Bld: 128 mg/dL — ABNORMAL HIGH (ref 70–99)
Potassium: 4.3 mmol/L (ref 3.5–5.1)
Sodium: 133 mmol/L — ABNORMAL LOW (ref 135–145)
Total Bilirubin: 0.4 mg/dL (ref 0.3–1.2)
Total Protein: 5.1 g/dL — ABNORMAL LOW (ref 6.5–8.1)

## 2023-06-05 LAB — PREPARE RBC (CROSSMATCH)

## 2023-06-05 LAB — CBC
HCT: 21.4 % — ABNORMAL LOW (ref 39.0–52.0)
Hemoglobin: 7.1 g/dL — ABNORMAL LOW (ref 13.0–17.0)
MCH: 33.6 pg (ref 26.0–34.0)
MCHC: 33.2 g/dL (ref 30.0–36.0)
MCV: 101.4 fL — ABNORMAL HIGH (ref 80.0–100.0)
Platelets: 225 10*3/uL (ref 150–400)
RBC: 2.11 MIL/uL — ABNORMAL LOW (ref 4.22–5.81)
RDW: 12.9 % (ref 11.5–15.5)
WBC: 7.4 10*3/uL (ref 4.0–10.5)
nRBC: 0 % (ref 0.0–0.2)

## 2023-06-05 LAB — CBC WITH DIFFERENTIAL/PLATELET
Abs Immature Granulocytes: 0.04 10*3/uL (ref 0.00–0.07)
Basophils Absolute: 0 10*3/uL (ref 0.0–0.1)
Basophils Relative: 1 %
Eosinophils Absolute: 0.1 10*3/uL (ref 0.0–0.5)
Eosinophils Relative: 2 %
HCT: 21.4 % — ABNORMAL LOW (ref 39.0–52.0)
Hemoglobin: 7.1 g/dL — ABNORMAL LOW (ref 13.0–17.0)
Immature Granulocytes: 1 %
Lymphocytes Relative: 21 %
Lymphs Abs: 1.4 10*3/uL (ref 0.7–4.0)
MCH: 33.5 pg (ref 26.0–34.0)
MCHC: 33.2 g/dL (ref 30.0–36.0)
MCV: 100.9 fL — ABNORMAL HIGH (ref 80.0–100.0)
Monocytes Absolute: 0.7 10*3/uL (ref 0.1–1.0)
Monocytes Relative: 11 %
Neutro Abs: 4.3 10*3/uL (ref 1.7–7.7)
Neutrophils Relative %: 64 %
Platelets: 251 10*3/uL (ref 150–400)
RBC: 2.12 MIL/uL — ABNORMAL LOW (ref 4.22–5.81)
RDW: 12.9 % (ref 11.5–15.5)
WBC: 6.5 10*3/uL (ref 4.0–10.5)
nRBC: 0 % (ref 0.0–0.2)

## 2023-06-05 LAB — MAGNESIUM: Magnesium: 1.5 mg/dL — ABNORMAL LOW (ref 1.7–2.4)

## 2023-06-05 LAB — PHOSPHORUS: Phosphorus: 2.7 mg/dL (ref 2.5–4.6)

## 2023-06-05 LAB — TYPE AND SCREEN
ABO/RH(D): O POS
Antibody Screen: NEGATIVE

## 2023-06-05 MED ORDER — ACETAMINOPHEN 500 MG PO TABS: 1000.0000 mg | ORAL_TABLET | Freq: Four times a day (QID) | ORAL | 0 refills | Status: DC | PRN

## 2023-06-05 MED ORDER — MAGNESIUM SULFATE 2 GM/50ML IV SOLN
2.0000 g | Freq: Once | INTRAVENOUS | Status: AC
  Administered 2023-06-05: 2 g via INTRAVENOUS
  Filled 2023-06-05: qty 50

## 2023-06-05 MED ORDER — APIXABAN 2.5 MG PO TABS: 2.5000 mg | ORAL_TABLET | Freq: Two times a day (BID) | ORAL | 0 refills | Status: DC

## 2023-06-05 MED ORDER — OXYCODONE HCL 5 MG PO TABS: 5.0000 mg | ORAL_TABLET | ORAL | 0 refills | Status: DC | PRN

## 2023-06-05 MED ORDER — SODIUM CHLORIDE 0.9 % IV SOLN: INTRAVENOUS | Status: DC

## 2023-06-05 MED ORDER — SODIUM CHLORIDE 0.9% IV SOLUTION: Freq: Once | INTRAVENOUS | Status: DC

## 2023-06-05 MED ORDER — MELATONIN 3 MG PO TABS
3.0000 mg | ORAL_TABLET | Freq: Every evening | ORAL | Status: DC | PRN
  Administered 2023-06-06 (×2): 3 mg via ORAL
  Filled 2023-06-05 (×2): qty 1

## 2023-06-05 NOTE — Plan of Care (Signed)

## 2023-06-05 NOTE — Care Management Important Message (Signed)
Important Message  Patient Details  Name: Andrew Montgomery MRN: [id number] Date of Birth: 24-Apr-1932   Medicare Important Message Given:  Yes     Sherilyn Banker 06/05/2023, 4:20 PM

## 2023-06-05 NOTE — Progress Notes (Signed)
Physical Therapy Treatment Patient Details Name: Andrew Montgomery MRN: [id number] DOB: [birth year] Today's Date: 06/05/2023   History of Present Illness 89 y.o. male admitted 6/9 with left hip and left ankle pain after mechanical fall at home. Same day s/p INTRAMEDULLARY NAIL LEFT INTERTROCHANTERIC.  Pt with medical history significant for hypertension, GERD, meningioma, peripheral neuropathy, and CKD stage III    PT Comments    Progressing well with amb distance and activity tolerance; No dizziness with amb; Standing BP after hallway amb was 102/44 -- low, but roughly the same as yesterday's response to more time in standing activity; DC rec for home with HHPT follow up continues to be appropriate  Recommendations for follow up therapy are one component of a multi-disciplinary discharge planning process, led by the attending physician.  Recommendations may be updated based on patient status, additional functional criteria and insurance authorization.  Follow Up Recommendations       Assistance Recommended at Discharge Intermittent Supervision/Assistance  Patient can return home with the following A little help with bathing/dressing/bathroom;Assistance with cooking/housework;Assist for transportation;Help with stairs or ramp for entrance;A little help with walking and/or transfers   Equipment Recommendations  Rolling walker (2 wheels)    Recommendations for Other Services       Precautions / Restrictions Precautions Precautions: Fall Precaution Comments: watch BP, pt isn't necessarily symptomatic when BP drops Restrictions LLE Weight Bearing: Weight bearing as tolerated     Mobility  Bed Mobility Overal bed mobility: Needs Assistance Bed Mobility: Supine to Sit, Sit to Supine     Supine to sit: Min guard Sit to supine: Min assist   General bed mobility comments: Bed flat and raised to approximate home; Close guard for supine to sit for safety, did not need physical assist; Min  assist to help LEs back into bed    Transfers Overall transfer level: Needs assistance Equipment used: Rolling walker (2 wheels) Transfers: Sit to/from Stand Sit to Stand: Min guard           General transfer comment: Minguard to stand from elevated bed (home height); good hand placemetn without need for cueing    Ambulation/Gait Ambulation/Gait assistance: Min guard Gait Distance (Feet): 120 Feet Assistive device: Rolling walker (2 wheels) Gait Pattern/deviations: Step-through pattern, Decreased stride length, Decreased stance time - left, Antalgic, Trunk flexed, Step-to pattern Gait velocity: decr     General Gait Details: No need for standing rest breaks this walk; no syncopal symptoms reported; giving continuing education re: RW use and sequencing, and posture   Stairs             Wheelchair Mobility    Modified Rankin (Stroke Patients Only)       Balance     Sitting balance-Leahy Scale: Fair       Standing balance-Leahy Scale: Poor (approaching Fair)                              Cognition Arousal/Alertness: Awake/alert Behavior During Therapy: WFL for tasks assessed/performed Overall Cognitive Status: Within Functional Limits for tasks assessed                                          Exercises      General Comments General comments (skin integrity, edema, etc.): No reports of dizziness with amb today; BP standing at bedside after  walk 102/44      Pertinent Vitals/Pain Pain Assessment Pain Assessment: Faces Faces Pain Scale: Hurts little more Pain Location: L hip with ambulation Pain Descriptors / Indicators: Aching, Discomfort Pain Intervention(s): Monitored during session    Home Living                          Prior Function            PT Goals (current goals can now be found in the care plan section) Acute Rehab PT Goals Patient Stated Goal: Do what it takes to get better PT Goal  Formulation: With patient Time For Goal Achievement: 06/17/23 Potential to Achieve Goals: Good Progress towards PT goals: Progressing toward goals    Frequency    Min 5X/week      PT Plan Current plan remains appropriate    Co-evaluation              AM-PAC PT "6 Clicks" Mobility   Outcome Measure  Help needed turning from your back to your side while in a flat bed without using bedrails?: A Little Help needed moving from lying on your back to sitting on the side of a flat bed without using bedrails?: A Little Help needed moving to and from a bed to a chair (including a wheelchair)?: A Little Help needed standing up from a chair using your arms (e.g., wheelchair or bedside chair)?: A Little Help needed to walk in hospital room?: A Little Help needed climbing 3-5 steps with a railing? : A Lot 6 Click Score: 17    End of Session Equipment Utilized During Treatment: Gait belt Activity Tolerance: Patient tolerated treatment well Patient left: in bed;with call bell/phone within reach;with family/visitor present Nurse Communication: Mobility status PT Visit Diagnosis: Unsteadiness on feet (R26.81);Other abnormalities of gait and mobility (R26.89);Muscle weakness (generalized) (M62.81);History of falling (Z91.81);Difficulty in walking, not elsewhere classified (R26.2);Pain Pain - Right/Left: Left Pain - part of body: Hip     Time: 1102-1140 PT Time Calculation (min) (ACUTE ONLY): 38 min  Charges:  $Gait Training: 23-37 mins $Therapeutic Activity: 8-22 mins                     Van Clines, PT  Acute Rehabilitation Services Office [phone] Secure Chat welcomed    Levi Aland 06/05/2023, 2:24 PM

## 2023-06-05 NOTE — Progress Notes (Signed)
PROGRESS NOTE    Andrew Montgomery  ZOX:[id number] DOB: 03-15-1932 DOA: 06/02/2023 PCP: Crist Fat, MD   Brief Narrative:  Andrew Montgomery is an 89 y.o. M with HTN, meningioma, CKD IIIa baseline 1.5 who presented with fall and left hip fracture.  He underwent cephalomedullary nail fixation and is postoperative day 2.  PT OT recommending home health.  Hospitalization has been complicated by orthostatic hypotension and syncope.  Also complicated by acute blood loss anemia as a postoperative cause and is getting typed and screened and transfused 1 unit PRBCs.  Assessment and Plan:   Left Hip Intertrochanteric Fracture   -Patient had cephalomedullary nail fixation and is Postoperative day 2 -Hemoglobin has dropped from 8.7 -> 7.3 -> 7.1. and will type and screen and transfuse 1 unit  -Patient had a syncopal episode secondary to orthostatsis currently being Treated with IV fluids -DVT prophylaxis with Enoxaparin -Orthopedic surgery following and continue pain regimen with Acetaminophen 1000 mg every 6h, gabapentin 600 g p.o. twice daily, hydromorphone 0.5 mg IV every 3 as needed for severe pain, methocarbamol 500 mg p.o. every 6 as needed for muscle spasms, and oxycodone 5 mg p.o. every 4 as needed moderate pain -Bowel regimen with senna docusate 1 tab p.o. nightly as needed, MiraLAX 17 g p.o. daily as needed for mild constipation and docusate 100 mg p.o. twice daily -Orthopedic surgery recommending weightbearing as tolerated to left lower extremity with unrestricted range of motion and changing Mepilex as needed; they are recommending enoxaparin while hospitalized and changing to Eliquis 2.5 mg p.o. twice daily for 30 days for DVT prophylaxis. -PT OT recommending home health once medically stable   Hypertension -> Orthostatic Hypotension -Hold all antihypertensives given patient is orthostatic positive treat with IV fluid bolus  -Continues to have soft BP and continuing Bisoprolol 5 mg po Daily -Had  Orthostatic Hypotension so will order TED Hose and Abdominal [facility] start normal saline at 75 MLS per hour -BP fluctuating  -Continue to Monitor BP per Protocol -Last BP reading was 133/51   AKI on CKD 3a  -SCr is 1.65 on admission, was 1.39 in March 2024  -Baseline Cr is around 1.5 -BUN/Cr Trend: Recent Labs  Lab 06/02/23 0047 06/02/23 0610 06/02/23 2053 06/03/23 0252 06/04/23 0226 06/05/23 0117  BUN 40* 32*  --  26* 31* 30*  CREATININE 1.65* 1.39* 1.28* 1.23 1.40* 1.28*  -Ordered normal saline at 75 MLS per hour -Avoid Nephrotoxic Medications, Contrast Dyes, Hypotension and Dehydration to Ensure Adequate Renal Perfusion and will need to Renally Adjust Meds -Continue to Monitor and Trend Renal Function carefully and repeat CMP in the AM   Hypomagnesemia -Patient's Mag Level Trend: Recent Labs  Lab 06/05/23 0117  MG 1.5*  -Replete with IV Mag Sulfate 2 Grams  -Continue to Monitor and Replete as Necessary -Repeat Mag in the AM   Postoperative Acute Blood Loss Anemia -Hgb/Hct Trend: Recent Labs  Lab 06/02/23 0047 06/02/23 0610 06/02/23 2053 06/03/23 0252 06/04/23 0226 06/05/23 0117 06/05/23 1205  HGB 10.0* 9.0* 9.6* 8.7* 7.3* 7.1* 7.1*  HCT 29.6* 27.2* 29.5* 26.7* 21.8* 21.4* 21.4*  MCV 100.3* 98.6 102.8* 101.5* 100.9* 101.4* 100.9*  -Type and Screen and Transfuse 1 unit of pRBCs -Continue to Monitor for S/Sx of Bleeding; No overt bleeding noted -Repeat CBC in the AM   Neuropathy  -Continue Gabapentin 600 mg po BID   GERD/GI Prophylaxis -Continue PPI with Pantoprazole 40 mg po Daily  Hypoalbuminemia -Patient's Albumin Trend: Recent Labs  Lab 06/02/23 0047 06/04/23 0226 06/05/23 0117  ALBUMIN 3.6 2.8* 2.7*  -Continue to Monitor and Trend and repeat CMP in the AM   DVT prophylaxis: Place TED hose Start: 06/05/23 1617 Place TED hose Start: 06/05/23 1442 enoxaparin (LOVENOX) injection 40 mg Start: 06/03/23 0800 SCDs Start: 06/02/23 1905 SCDs  Start: 06/02/23 0206    Code Status: Full Code Family Communication: No family present at bedside  Disposition Plan:  Level of care: Med-Surg Status is: Inpatient Remains inpatient appropriate because: PT OT recommending home health   Consultants:  Orthopedic surgery  Procedures:  CPT 27245-Cephalomedullary nailing of left intertrochanteric femur fracture   Antimicrobials:  Anti-infectives (From admission, onward)    Start     Dose/Rate Route Frequency Ordered Stop   06/03/23 0000  ceFAZolin (ANCEF) IVPB 2g/100 mL premix        2 g 200 mL/hr over 30 Minutes Intravenous Every 8 hours 06/02/23 1904 06/03/23 1628   06/02/23 1632  vancomycin (VANCOCIN) powder  Status:  Discontinued          As needed 06/02/23 1632 06/02/23 1646   06/02/23 1530  ceFAZolin (ANCEF) IVPB 2g/100 mL premix        2 g 200 mL/hr over 30 Minutes Intravenous  Once 06/02/23 1515 06/02/23 1610   06/02/23 1515  ceFAZolin (ANCEF) 2-4 GM/100ML-% IVPB       Note to Pharmacy: Kathrene Bongo D: cabinet override      06/02/23 1515 06/02/23 1550       Subjective: Seen and examined at bedside and he is doing okay.  Denies chest pain or shortness of breath.  Blood pressure dropped this morning again.  PT OT recommending home health.  Given his hemoglobin being low we will type and screen and transfuse 1 unit of PRBCs.  He denies any other concerns or plaints at this time.  Objective: Vitals:   06/05/23 0421 06/05/23 0715 06/05/23 1136 06/05/23 1517  BP: (!) 127/56 (!) 127/56 (!) 102/44 (!) 133/51  Pulse: 60 66  (!) 57  Resp: 17 17  15   Temp: 98.3 F (36.8 C) 98.6 F (37 C)  97.9 F (36.6 C)  TempSrc: Oral Oral  Oral  SpO2: 97% 97%  100%  Weight:      Height:        Intake/Output Summary (Last 24 hours) at 06/05/2023 1625 Last data filed at 06/05/2023 1500 Gross per 24 hour  Intake 720 ml  Output 1050 ml  Net -330 ml   Filed Weights   06/02/23 0039 06/02/23 1438  Weight: 83 kg 83 kg    Examination: Physical Exam:  Constitutional: WN/WD elderly overweight Caucasian male in no acute distress Respiratory: Diminished to auscultation bilaterally, no wheezing, rales, rhonchi or crackles. Normal respiratory effort and patient is not tachypenic. No accessory muscle use.  Unlabored breathing Cardiovascular: RRR, no murmurs / rubs / gallops. S1 and S2 auscultated. No extremity edema.  Abdomen: Soft, non-tender, distended secondary to body habitus. Bowel sounds positive.  GU: Deferred. Musculoskeletal: No clubbing / cyanosis of digits/nails. No joint deformity upper and lower extremities.  Skin: No rashes, lesions, ulcers limited skin evaluation. No induration; Warm and dry.  Neurologic: CN 2-12 grossly intact with no focal deficits. Romberg sign and cerebellar reflexes not assessed.  Psychiatric: Normal judgment and insight. Alert and oriented x 3. Normal mood and appropriate affect.   Data Reviewed: I have personally reviewed following labs and imaging studies  CBC: Recent Labs  Lab 06/02/23 0047 06/02/23  1610 06/02/23 2053 06/03/23 0252 06/04/23 0226 06/05/23 0117 06/05/23 1205  WBC 12.8*   < > 12.7* 7.9 7.2 7.4 6.5  NEUTROABS 11.4*  --   --   --  4.6  --  4.3  HGB 10.0*   < > 9.6* 8.7* 7.3* 7.1* 7.1*  HCT 29.6*   < > 29.5* 26.7* 21.8* 21.4* 21.4*  MCV 100.3*   < > 102.8* 101.5* 100.9* 101.4* 100.9*  PLT 249   < > 268 256 220 225 251   < > = values in this interval not displayed.   Basic Metabolic Panel: Recent Labs  Lab 06/02/23 0047 06/02/23 0610 06/02/23 2053 06/03/23 0252 06/04/23 0226 06/05/23 0117  NA 132* 133*  --  132* 132* 133*  K 4.0 4.0  --  4.8 4.5 4.3  CL 102 102  --  100 98 96*  CO2 20* 22  --  23 26 27   GLUCOSE 128* 120*  --  152* 114* 128*  BUN 40* 32*  --  26* 31* 30*  CREATININE 1.65* 1.39* 1.28* 1.23 1.40* 1.28*  CALCIUM 8.7* 8.5*  --  8.3* 8.4* 8.3*  MG  --   --   --   --   --  1.5*  PHOS  --   --   --   --   --  2.7    GFR: Estimated Creatinine Clearance: 39.1 mL/min (A) (by C-G formula based on SCr of 1.28 mg/dL (H)). Liver Function Tests: Recent Labs  Lab 06/02/23 0047 06/04/23 0226 06/05/23 0117  AST 24 20 22   ALT 15 8 9   ALKPHOS 52 38 39  BILITOT 0.4 0.3 0.4  PROT 6.2* 5.0* 5.1*  ALBUMIN 3.6 2.8* 2.7*   No results for input(s): "LIPASE", "AMYLASE" in the last 168 hours. No results for input(s): "AMMONIA" in the last 168 hours. Coagulation Profile: Recent Labs  Lab 06/02/23 0047  INR 1.0   Cardiac Enzymes: No results for input(s): "CKTOTAL", "CKMB", "CKMBINDEX", "TROPONINI" in the last 168 hours. BNP (last 3 results) No results for input(s): "PROBNP" in the last 8760 hours. HbA1C: No results for input(s): "HGBA1C" in the last 72 hours. CBG: No results for input(s): "GLUCAP" in the last 168 hours. Lipid Profile: No results for input(s): "CHOL", "HDL", "LDLCALC", "TRIG", "CHOLHDL", "LDLDIRECT" in the last 72 hours. Thyroid Function Tests: No results for input(s): "TSH", "T4TOTAL", "FREET4", "T3FREE", "THYROIDAB" in the last 72 hours. Anemia Panel: No results for input(s): "VITAMINB12", "FOLATE", "FERRITIN", "TIBC", "IRON", "RETICCTPCT" in the last 72 hours. Sepsis Labs: No results for input(s): "PROCALCITON", "LATICACIDVEN" in the last 168 hours.  Recent Results (from the past 240 hour(s))  Surgical pcr screen     Status: Abnormal   Collection Time: 06/02/23  4:13 AM   Specimen: Nasal Mucosa; Nasal Swab  Result Value Ref Range Status   MRSA, PCR NEGATIVE NEGATIVE Final   Staphylococcus aureus POSITIVE (A) NEGATIVE Final    Comment: (NOTE) The Xpert SA Assay (FDA approved for NASAL specimens in patients 10 years of age and older), is one component of a comprehensive surveillance program. It is not intended to diagnose infection nor to guide or monitor treatment. Performed at [facility], [address], [place], [place] [address]     Radiology Studies: No results  found.  Scheduled Meds:  sodium chloride   Intravenous Once   acetaminophen  1,000 mg Oral Q6H   bisoprolol  5 mg Oral Daily   Chlorhexidine Gluconate Cloth  6 each Topical Q0600   docusate sodium  100 mg Oral BID   enoxaparin (LOVENOX) injection  40 mg Subcutaneous Q24H   feeding supplement  237 mL Oral BID BM   gabapentin  600 mg Oral BID   multivitamin with minerals  1 tablet Oral Daily   mupirocin ointment  1 Application Nasal BID   pantoprazole  40 mg Oral Daily   Continuous Infusions:  sodium chloride      LOS: 3 days   Marguerita Merles, DO [facility] Available via Epic secure chat 7am-7pm After these hours, please refer to coverage provider listed on [email or web address] 06/05/2023, 4:25 PM

## 2023-06-06 DIAGNOSIS — I951 Orthostatic hypotension: Secondary | ICD-10-CM

## 2023-06-06 DIAGNOSIS — S72002A Fracture of unspecified part of neck of left femur, initial encounter for closed fracture: Secondary | ICD-10-CM | POA: Diagnosis not present

## 2023-06-06 DIAGNOSIS — N1832 Chronic kidney disease, stage 3b: Secondary | ICD-10-CM | POA: Diagnosis not present

## 2023-06-06 DIAGNOSIS — I10 Essential (primary) hypertension: Secondary | ICD-10-CM | POA: Diagnosis not present

## 2023-06-06 LAB — CBC WITH DIFFERENTIAL/PLATELET
Abs Immature Granulocytes: 0.03 10*3/uL (ref 0.00–0.07)
Abs Immature Granulocytes: 0.07 10*3/uL (ref 0.00–0.07)
Basophils Absolute: 0 10*3/uL (ref 0.0–0.1)
Basophils Absolute: 0.1 10*3/uL (ref 0.0–0.1)
Basophils Relative: 1 %
Basophils Relative: 1 %
Eosinophils Absolute: 0.2 10*3/uL (ref 0.0–0.5)
Eosinophils Absolute: 0.2 10*3/uL (ref 0.0–0.5)
Eosinophils Relative: 3 %
Eosinophils Relative: 2 %
HCT: 24.7 % — ABNORMAL LOW (ref 39.0–52.0)
HCT: 24.6 % — ABNORMAL LOW (ref 39.0–52.0)
Hemoglobin: 8.2 g/dL — ABNORMAL LOW (ref 13.0–17.0)
Hemoglobin: 8 g/dL — ABNORMAL LOW (ref 13.0–17.0)
Immature Granulocytes: 0 %
Immature Granulocytes: 1 %
Lymphocytes Relative: 26 %
Lymphocytes Relative: 18 %
Lymphs Abs: 1.8 10*3/uL (ref 0.7–4.0)
Lymphs Abs: 1.3 10*3/uL (ref 0.7–4.0)
MCH: 32.5 pg (ref 26.0–34.0)
MCH: 32.3 pg (ref 26.0–34.0)
MCHC: 33.2 g/dL (ref 30.0–36.0)
MCHC: 32.5 g/dL (ref 30.0–36.0)
MCV: 98 fL (ref 80.0–100.0)
MCV: 99.2 fL (ref 80.0–100.0)
Monocytes Absolute: 0.7 10*3/uL (ref 0.1–1.0)
Monocytes Absolute: 0.8 10*3/uL (ref 0.1–1.0)
Monocytes Relative: 10 %
Monocytes Relative: 11 %
Neutro Abs: 4.2 10*3/uL (ref 1.7–7.7)
Neutro Abs: 5 10*3/uL (ref 1.7–7.7)
Neutrophils Relative %: 60 %
Neutrophils Relative %: 67 %
Platelets: 265 10*3/uL (ref 150–400)
Platelets: 267 10*3/uL (ref 150–400)
RBC: 2.52 MIL/uL — ABNORMAL LOW (ref 4.22–5.81)
RBC: 2.48 MIL/uL — ABNORMAL LOW (ref 4.22–5.81)
RDW: 13.2 % (ref 11.5–15.5)
RDW: 13.3 % (ref 11.5–15.5)
WBC: 6.9 10*3/uL (ref 4.0–10.5)
WBC: 7.4 10*3/uL (ref 4.0–10.5)
nRBC: 0 % (ref 0.0–0.2)
nRBC: 0 % (ref 0.0–0.2)

## 2023-06-06 LAB — COMPREHENSIVE METABOLIC PANEL
ALT: 11 U/L (ref 0–44)
AST: 24 U/L (ref 15–41)
Albumin: 2.7 g/dL — ABNORMAL LOW (ref 3.5–5.0)
Alkaline Phosphatase: 45 U/L (ref 38–126)
Anion gap: 7 (ref 5–15)
BUN: 26 mg/dL — ABNORMAL HIGH (ref 8–23)
CO2: 26 mmol/L (ref 22–32)
Calcium: 8.3 mg/dL — ABNORMAL LOW (ref 8.9–10.3)
Chloride: 99 mmol/L (ref 98–111)
Creatinine, Ser: 1.03 mg/dL (ref 0.61–1.24)
GFR, Estimated: >60 mL/min (ref >60)
Glucose, Bld: 99 mg/dL (ref 70–99)
Potassium: 4.3 mmol/L (ref 3.5–5.1)
Sodium: 132 mmol/L — ABNORMAL LOW (ref 135–145)
Total Bilirubin: 1.2 mg/dL (ref 0.3–1.2)
Total Protein: 5.4 g/dL — ABNORMAL LOW (ref 6.5–8.1)

## 2023-06-06 LAB — TYPE AND SCREEN
Antibody Screen: NEGATIVE
Unit division: 0

## 2023-06-06 LAB — PHOSPHORUS: Phosphorus: 3 mg/dL (ref 2.5–4.6)

## 2023-06-06 LAB — BPAM RBC
Blood Product Expiration Date: 202407072359
ISSUE DATE / TIME: 202406121728
Unit Type and Rh: 5100

## 2023-06-06 LAB — HEMOGLOBIN AND HEMATOCRIT, BLOOD
HCT: 26.3 % — ABNORMAL LOW (ref 39.0–52.0)
Hemoglobin: 8.8 g/dL — ABNORMAL LOW (ref 13.0–17.0)

## 2023-06-06 LAB — MAGNESIUM: Magnesium: 1.8 mg/dL (ref 1.7–2.4)

## 2023-06-06 MED ORDER — SODIUM CHLORIDE 0.9 % IV BOLUS
500.0000 mL | Freq: Once | INTRAVENOUS | Status: AC
  Administered 2023-06-06: 500 mL via INTRAVENOUS

## 2023-06-06 MED ORDER — MAGNESIUM SULFATE 2 GM/50ML IV SOLN
2.0000 g | Freq: Once | INTRAVENOUS | Status: AC
  Administered 2023-06-06: 2 g via INTRAVENOUS
  Filled 2023-06-06: qty 50

## 2023-06-06 NOTE — Progress Notes (Signed)
Physical Therapy Treatment Patient Details Name: Andrew Montgomery MRN: [id number] DOB: May 28, 1932 Today's Date: 06/06/2023   History of Present Illness 89 y.o. male admitted 6/9 with left hip and left ankle pain after mechanical fall at home. Same day s/p INTRAMEDULLARY NAIL LEFT INTERTROCHANTERIC.  Pt with medical history significant for hypertension, GERD, meningioma, peripheral neuropathy, and CKD stage III    PT Comments    Excellent progress, denies pre-syncopal symptoms throughout session. Ambulating up to 100 feet. BP 133/55 in standing after walking. Acute rehab goals adequately met for d/c once medically stable. Son in room, very supportive, able to provide assist as needed at home. Will continue to follow and progress independence during admission. Tolerated LE exercises well.   Recommendations for follow up therapy are one component of a multi-disciplinary discharge planning process, led by the attending physician.  Recommendations may be updated based on patient status, additional functional criteria and insurance authorization.  Follow Up Recommendations       Assistance Recommended at Discharge Intermittent Supervision/Assistance  Patient can return home with the following A little help with bathing/dressing/bathroom;Assistance with cooking/housework;Assist for transportation;Help with stairs or ramp for entrance;A little help with walking and/or transfers   Equipment Recommendations  Rolling walker (2 wheels)    Recommendations for Other Services       Precautions / Restrictions Precautions Precautions: Fall Precaution Comments: watch BP, pt isn't necessarily symptomatic when BP drops Restrictions Weight Bearing Restrictions: Yes LLE Weight Bearing: Weight bearing as tolerated     Mobility  Bed Mobility Overal bed mobility: Needs Assistance Bed Mobility: Supine to Sit     Supine to sit: Supervision     General bed mobility comments: Utilized leg loop, slow and  cautious but without physical assist this morning.    Transfers Overall transfer level: Needs assistance Equipment used: Rolling walker (2 wheels) Transfers: Sit to/from Stand Sit to Stand: Supervision           General transfer comment: Supervision for safety, slow to rise but stable with use of RW for support. Cues for symptom awareness.    Ambulation/Gait Ambulation/Gait assistance: Min guard Gait Distance (Feet): 100 Feet Assistive device: Rolling walker (2 wheels) Gait Pattern/deviations: Step-through pattern, Decreased stride length, Decreased stance time - left, Antalgic, Trunk flexed Gait velocity: decr Gait velocity interpretation: <1.31 ft/sec, indicative of household ambulator   General Gait Details: Denies pre-syncopal symptoms. Educated further on AD use, gait symmetry, and symptom awareness. Still moderately antalgic but without overt buckling or LOB using RW.   Stairs             Wheelchair Mobility    Modified Rankin (Stroke Patients Only)       Balance Overall balance assessment: Needs assistance Sitting-balance support: Feet supported Sitting balance-Leahy Scale: Fair     Standing balance support: During functional activity Standing balance-Leahy Scale: Poor (approaching Fair)                              Cognition Arousal/Alertness: Awake/alert Behavior During Therapy: WFL for tasks assessed/performed Overall Cognitive Status: Within Functional Limits for tasks assessed                                          Exercises General Exercises - Lower Extremity Ankle Circles/Pumps: AROM, Both, 10 reps, Seated Quad Sets: Strengthening, Both, 10  reps, Seated Long Arc Quad: Strengthening, Both, 10 reps, Seated Heel Slides: AAROM, Left, 5 reps, Seated    General Comments General comments (skin integrity, edema, etc.): BP in standing post ambulatory bout 133/55, HR 58      Pertinent Vitals/Pain Pain  Assessment Pain Assessment: Faces Faces Pain Scale: Hurts little more Pain Location: L hip with ambulation Pain Descriptors / Indicators: Aching, Discomfort Pain Intervention(s): Monitored during session, Repositioned, Limited activity within patient's tolerance, Premedicated before session    Home Living                          Prior Function            PT Goals (current goals can now be found in the care plan section) Acute Rehab PT Goals Patient Stated Goal: Do what it takes to get better PT Goal Formulation: With patient Time For Goal Achievement: 06/17/23 Potential to Achieve Goals: Good Progress towards PT goals: Progressing toward goals    Frequency    Min 5X/week      PT Plan Current plan remains appropriate    Co-evaluation              AM-PAC PT "6 Clicks" Mobility   Outcome Measure  Help needed turning from your back to your side while in a flat bed without using bedrails?: A Little Help needed moving from lying on your back to sitting on the side of a flat bed without using bedrails?: A Little Help needed moving to and from a bed to a chair (including a wheelchair)?: A Little Help needed standing up from a chair using your arms (e.g., wheelchair or bedside chair)?: A Little Help needed to walk in hospital room?: A Little Help needed climbing 3-5 steps with a railing? : A Lot 6 Click Score: 17    End of Session Equipment Utilized During Treatment: Gait belt Activity Tolerance: Patient tolerated treatment well Patient left: with call bell/phone within reach;in chair;with chair alarm set;with family/visitor present;with SCD's reapplied Nurse Communication: Mobility status PT Visit Diagnosis: Unsteadiness on feet (R26.81);Other abnormalities of gait and mobility (R26.89);Muscle weakness (generalized) (M62.81);History of falling (Z91.81);Difficulty in walking, not elsewhere classified (R26.2);Pain Pain - Right/Left: Left Pain - part of body:  Hip     Time: 1610-9604 PT Time Calculation (min) (ACUTE ONLY): 17 min  Charges:  $Gait Training: 8-22 mins                     Kathlyn Sacramento, PT, DPT [facility]  Rehabilitation Services Physical Therapist Office: [phone] Website: [email or web address]    Berton Mount 06/06/2023, 11:10 AM

## 2023-06-06 NOTE — Progress Notes (Signed)
Occupational Therapy Treatment Patient Details Name: Andrew Montgomery MRN: [id number] DOB: 1932/04/25 Today's Date: 06/06/2023   History of present illness 89 y.o. male admitted 6/9 with left hip and left ankle pain after mechanical fall at home. Same day s/p INTRAMEDULLARY NAIL LEFT INTERTROCHANTERIC.  Pt with medical history significant for hypertension, GERD, meningioma, peripheral neuropathy, and CKD stage III   OT comments  Pt continuing to progress in OT sessions, continued family education on the use of gait belt and allowed family to provide level of assist pt needed with functional mobility. Pt's son demonstrating good recall of gait belt use, min cueing for him to adjust position and tension of gait belt appropriately, he demonstrated proper hand placement with use. Pt ambulating with min guard assist + RW and completing bADLs with min guard assist as well. Discussed with pt and family the exploration of leisure pursuits with constructive tasks such as building bird houses to occupy pt time during recover. Also discussed with family and patient reducing prolonged standing if pt BP does not level out by end of DC.    Recommendations for follow up therapy are one component of a multi-disciplinary discharge planning process, led by the attending physician.  Recommendations may be updated based on patient status, additional functional criteria and insurance authorization.    Assistance Recommended at Discharge Intermittent Supervision/Assistance  Patient can return home with the following  A little help with walking and/or transfers;A little help with bathing/dressing/bathroom;Assistance with cooking/housework;Help with stairs or ramp for entrance   Equipment Recommendations  None recommended by OT (Pt has rec DME)    Recommendations for Other Services      Precautions / Restrictions Precautions Precautions: Fall Precaution Comments: watch BP, pt isn't necessarily symptomatic when BP  drops Restrictions Weight Bearing Restrictions: Yes LLE Weight Bearing: Weight bearing as tolerated       Mobility Bed Mobility Overal bed mobility: Needs Assistance Bed Mobility: Supine to Sit, Sit to Supine     Supine to sit: Supervision Sit to supine: Min guard   General bed mobility comments: Pt using gait belt to act as leg lifter, good carryover of previously learned strategies    Transfers Overall transfer level: Needs assistance Equipment used: Rolling walker (2 wheels) Transfers: Sit to/from Stand Sit to Stand: Supervision                 Balance Overall balance assessment: Needs assistance Sitting-balance support: Feet supported Sitting balance-Leahy Scale: Fair     Standing balance support: During functional activity, Bilateral upper extremity supported Standing balance-Leahy Scale: Poor                             ADL either performed or assessed with clinical judgement   ADL                       Lower Body Dressing: Sitting/lateral leans;Min guard   Toilet Transfer: Min guard;Ambulation;Rolling walker (2 wheels)   Toileting- Clothing Manipulation and Hygiene: Min guard;Sit to/from stand Toileting - Clothing Manipulation Details (indicate cue type and reason): partial squat rear pericare min guard     Functional mobility during ADLs: Min guard;Rolling walker (2 wheels) General ADL Comments: Continued family education this session, instructing patient's son on the use of gait belt and having him trial the needed level of assist with hall level ambulation and functional transfers    Extremity/Trunk Assessment  Vision       Perception     Praxis      Cognition Arousal/Alertness: Awake/alert Behavior During Therapy: WFL for tasks assessed/performed Overall Cognitive Status: Within Functional Limits for tasks assessed                                          Exercises       Shoulder Instructions       General Comments Pt without c/o dizziness upon standing functional activity    Pertinent Vitals/ Pain       Pain Assessment Pain Assessment: Faces Faces Pain Scale: Hurts little more Pain Location: L hip with ambulation and position changes Pain Descriptors / Indicators: Aching, Discomfort, Sharp Pain Intervention(s): Monitored during session, Repositioned  Home Living                                          Prior Functioning/Environment              Frequency  Min 3X/week        Progress Toward Goals  OT Goals(current goals can now be found in the care plan section)  Progress towards OT goals: Progressing toward goals  Acute Rehab OT Goals Patient Stated Goal: To go home OT Goal Formulation: With patient/family Time For Goal Achievement: 06/17/23 Potential to Achieve Goals: Good  Plan Discharge plan needs to be updated;Frequency remains appropriate    Co-evaluation                 AM-PAC OT "6 Clicks" Daily Activity     Outcome Measure   Help from another person eating meals?: None Help from another person taking care of personal grooming?: A Little Help from another person toileting, which includes using toliet, bedpan, or urinal?: A Little Help from another person bathing (including washing, rinsing, drying)?: A Little Help from another person to put on and taking off regular upper body clothing?: None Help from another person to put on and taking off regular lower body clothing?: A Little 6 Click Score: 20    End of Session Equipment Utilized During Treatment: Gait belt;Rolling walker (2 wheels)  OT Visit Diagnosis: Unsteadiness on feet (R26.81);History of falling (Z91.81);Pain Pain - Right/Left: Left Pain - part of body: Hip   Activity Tolerance Patient tolerated treatment well   Patient Left in bed;with call bell/phone within reach;with family/visitor present   Nurse Communication Mobility  status        Time: 1610-9604 OT Time Calculation (min): 34 min  Charges: OT General Charges $OT Visit: 1 Visit OT Treatments $Self Care/Home Management : 8-22 mins $Therapeutic Activity: 8-22 mins  06/06/2023  AB, OTR/L  Acute Rehabilitation Services  Office: [phone]   Tristan Schroeder 06/06/2023, 4:37 PM

## 2023-06-06 NOTE — Progress Notes (Signed)
PROGRESS NOTE    KOLBEE THIELEN  MWN:[id number] DOB: Jan 01, 1932 DOA: 06/02/2023 PCP: Crist Fat, MD   Brief Narrative:  Mr. Andrew Montgomery is an 89 y.o. M with HTN, meningioma, CKD IIIa baseline 1.5 who presented with fall and left hip fracture.  He underwent cephalomedullary nail fixation and is postoperative day 2.  PT OT recommending home health.  Hospitalization has been complicated by orthostatic hypotension and syncope.  Also complicated by acute blood loss anemia as a postoperative cause and is getting typed and screened and transfused 1 unit PRBCs.  Continues remain orthostatic so we will give another bolus and continue monitor overnight and if stable with stable blood count can be discharged home with home health in the morning.  Assessment and Plan:   Left Hip Intertrochanteric Fracture   -Patient had cephalomedullary nail fixation and is Postoperative day 2 -Hemoglobin has dropped from 8.7 -> 7.3 -> 7.1. and will type and screen and transfuse 1 unit  -Patient had a syncopal episode secondary to orthostatsis currently being Treated with IV fluids -DVT prophylaxis with Enoxaparin -Orthopedic surgery following and continue pain regimen with Acetaminophen 1000 mg every 6h, gabapentin 600 g p.o. twice daily, hydromorphone 0.5 mg IV every 3 as needed for severe pain, methocarbamol 500 mg p.o. every 6 as needed for muscle spasms, and oxycodone 5 mg p.o. every 4 as needed moderate pain -Bowel regimen with senna docusate 1 tab p.o. nightly as needed, MiraLAX 17 g p.o. daily as needed for mild constipation and docusate 100 mg p.o. twice daily -Orthopedic surgery recommending weightbearing as tolerated to left lower extremity with unrestricted range of motion and changing Mepilex as needed; they are recommending enoxaparin while hospitalized and changing to Eliquis 2.5 mg p.o. twice daily for 30 days for DVT prophylaxis. -PT OT recommending home health once medically stable   Hypertension ->  Orthostatic Hypotension, persists -Hold all antihypertensives given patient is orthostatic positive treat with IV fluid bolus  -Continues to have soft BP and continuing Bisoprolol 5 mg po Daily -Had Orthostatic Hypotension so will order TED Hose and Abdominal Binder -Started normal saline at 75 MLS per hour but will stop and give a Bolus; Repeat Orthostatics in the AM -BP fluctuating and Orthostatics done and went from 144/56 Lying -> 159/68 Sitting -> 126/62 Standing and then 118/54 Standing at 3 Min -Continue to Monitor BP per Protocol -Last BP reading was 126/62   AKI on CKD 3a  -SCr is 1.65 on admission, was 1.39 in March 2024  -Baseline Cr is around 1.5 -BUN/Cr Trend: Recent Labs  Lab 06/02/23 0047 06/02/23 0610 06/02/23 2053 06/03/23 0252 06/04/23 0226 06/05/23 0117 06/06/23 0421  BUN 40* 32*  --  26* 31* 30* 26*  CREATININE 1.65* 1.39* 1.28* 1.23 1.40* 1.28* 1.03  -Ordered normal saline at 75 MLS per hour -Avoid Nephrotoxic Medications, Contrast Dyes, Hypotension and Dehydration to Ensure Adequate Renal Perfusion and will need to Renally Adjust Meds -Continue to Monitor and Trend Renal Function carefully and repeat CMP in the AM   Hypomagnesemia -Patient's Mag Level Trend: Recent Labs  Lab 06/05/23 0117 06/06/23 0421  MG 1.5* 1.8  -Replete with IV Mag Sulfate 2 Grams  -Continue to Monitor and Replete as Necessary -Repeat Mag in the AM   Postoperative Acute Blood Loss Anemia -Hgb/Hct Trend: Recent Labs  Lab 06/03/23 0252 06/04/23 0226 06/05/23 0117 06/05/23 1205 06/05/23 2336 06/06/23 0421 06/06/23 1253  HGB 8.7* 7.3* 7.1* 7.1* 8.8* 8.2* 8.0*  HCT 26.7* 21.8*  21.4* 21.4* 26.3* 24.7* 24.6*  MCV 101.5* 100.9* 101.4* 100.9*  --  98.0 99.2  -Typed and Screen and Transfuse 1 unit of pRBCs -Continue to Monitor for S/Sx of Bleeding; No overt bleeding noted -Repeat CBC in the AM and if remains stable and not orthostatic can likely D/C   Neuropathy  -Continue  Gabapentin 600 mg po BID   GERD/GI Prophylaxis -Continue PPI with Pantoprazole 40 mg po Daily  Hypoalbuminemia -Patient's Albumin Trend: Recent Labs  Lab 06/02/23 0047 06/04/23 0226 06/05/23 0117 06/06/23 0421  ALBUMIN 3.6 2.8* 2.7* 2.7*  -Continue to Monitor and Trend and repeat CMP in the AM  DVT prophylaxis: Place TED hose Start: 06/06/23 1427 Place TED hose Start: 06/05/23 1617 Place TED hose Start: 06/05/23 1442 enoxaparin (LOVENOX) injection 40 mg Start: 06/03/23 0800 SCDs Start: 06/02/23 1905 SCDs Start: 06/02/23 0206    Code Status: Full Code Family Communication: No family present at bedside  Disposition Plan:  Level of care: Med-Surg Status is: Inpatient Remains inpatient appropriate because: Anticipating discharge home with home health in next 24 to 48 hours if stable and no longer orthostatic   Consultants:  Orthopedic surgery  Procedures:  CPT 27245-Cephalomedullary nailing of left intertrochanteric femur fracture   Antimicrobials:  Anti-infectives (From admission, onward)    Start     Dose/Rate Route Frequency Ordered Stop   06/03/23 0000  ceFAZolin (ANCEF) IVPB 2g/100 mL premix        2 g 200 mL/hr over 30 Minutes Intravenous Every 8 hours 06/02/23 1904 06/03/23 1628   06/02/23 1632  vancomycin (VANCOCIN) powder  Status:  Discontinued          As needed 06/02/23 1632 06/02/23 1646   06/02/23 1530  ceFAZolin (ANCEF) IVPB 2g/100 mL premix        2 g 200 mL/hr over 30 Minutes Intravenous  Once 06/02/23 1515 06/02/23 1610   06/02/23 1515  ceFAZolin (ANCEF) 2-4 GM/100ML-% IVPB       Note to Pharmacy: Kathrene Bongo D: cabinet override      06/02/23 1515 06/02/23 1550       Subjective: Seen and examined at bedside he is doing okay but blood pressure dropped again.  He denies any nausea vomiting.  Wanting to rest and feels sleepy.  No other concerns or complaints at this time.  Objective: Vitals:   06/06/23 0732 06/06/23 1339 06/06/23 1343  06/06/23 1348  BP: (!) 135/55 (!) 144/56 (!) 159/68 126/62  Pulse: (!) 58 (!) 58 63 69  Resp: 18 15    Temp: 97.9 F (36.6 C) 97.7 F (36.5 C)    TempSrc: Oral Oral    SpO2: 100% 99% 100% (!) 87%  Weight:      Height:        Intake/Output Summary (Last 24 hours) at 06/06/2023 1807 Last data filed at 06/06/2023 1439 Gross per 24 hour  Intake 1855.78 ml  Output 1750 ml  Net 105.78 ml   Filed Weights   06/02/23 0039 06/02/23 1438  Weight: 83 kg 83 kg   Examination: Physical Exam:  Constitutional: WN/WD elderly overweight Caucasian male in no acute distress resting and wanting to sleep Respiratory: Diminished to auscultation bilaterally, no wheezing, rales, rhonchi or crackles. Normal respiratory effort and patient is not tachypenic. No accessory muscle use.  Unlabored breathing Cardiovascular: RRR, no murmurs / rubs / gallops. S1 and S2 auscultated. No extremity edema.  Abdomen: Soft, non-tender, distended secondary to body habitus. Bowel sounds positive.  GU: Deferred.  Musculoskeletal: No clubbing / cyanosis of digits/nails. No joint deformity upper and lower extremities.  Skin: No rashes, lesions, ulcers on limited skin evaluation. No induration; Warm and dry.  Neurologic: CN 2-12 grossly intact with no focal deficits. Romberg sign and cerebellar reflexes not assessed.  Psychiatric: Little somnolent and drowsy but easily arousable and alert and oriented times  Data Reviewed: I have personally reviewed following labs and imaging studies  CBC: Recent Labs  Lab 06/02/23 0047 06/02/23 0610 06/04/23 0226 06/05/23 0117 06/05/23 1205 06/05/23 2336 06/06/23 0421 06/06/23 1253  WBC 12.8*   < > 7.2 7.4 6.5  --  6.9 7.4  NEUTROABS 11.4*  --  4.6  --  4.3  --  4.2 5.0  HGB 10.0*   < > 7.3* 7.1* 7.1* 8.8* 8.2* 8.0*  HCT 29.6*   < > 21.8* 21.4* 21.4* 26.3* 24.7* 24.6*  MCV 100.3*   < > 100.9* 101.4* 100.9*  --  98.0 99.2  PLT 249   < > 220 225 251  --  265 267   < > = values in  this interval not displayed.   Basic Metabolic Panel: Recent Labs  Lab 06/02/23 0610 06/02/23 2053 06/03/23 0252 06/04/23 0226 06/05/23 0117 06/06/23 0421  NA 133*  --  132* 132* 133* 132*  K 4.0  --  4.8 4.5 4.3 4.3  CL 102  --  100 98 96* 99  CO2 22  --  23 26 27 26   GLUCOSE 120*  --  152* 114* 128* 99  BUN 32*  --  26* 31* 30* 26*  CREATININE 1.39* 1.28* 1.23 1.40* 1.28* 1.03  CALCIUM 8.5*  --  8.3* 8.4* 8.3* 8.3*  MG  --   --   --   --  1.5* 1.8  PHOS  --   --   --   --  2.7 3.0   GFR: Estimated Creatinine Clearance: 48.6 mL/min (by C-G formula based on SCr of 1.03 mg/dL). Liver Function Tests: Recent Labs  Lab 06/02/23 0047 06/04/23 0226 06/05/23 0117 06/06/23 0421  AST 24 20 22 24   ALT 15 8 9 11   ALKPHOS 52 38 39 45  BILITOT 0.4 0.3 0.4 1.2  PROT 6.2* 5.0* 5.1* 5.4*  ALBUMIN 3.6 2.8* 2.7* 2.7*   No results for input(s): "LIPASE", "AMYLASE" in the last 168 hours. No results for input(s): "AMMONIA" in the last 168 hours. Coagulation Profile: Recent Labs  Lab 06/02/23 0047  INR 1.0   Cardiac Enzymes: No results for input(s): "CKTOTAL", "CKMB", "CKMBINDEX", "TROPONINI" in the last 168 hours. BNP (last 3 results) No results for input(s): "PROBNP" in the last 8760 hours. HbA1C: No results for input(s): "HGBA1C" in the last 72 hours. CBG: No results for input(s): "GLUCAP" in the last 168 hours. Lipid Profile: No results for input(s): "CHOL", "HDL", "LDLCALC", "TRIG", "CHOLHDL", "LDLDIRECT" in the last 72 hours. Thyroid Function Tests: No results for input(s): "TSH", "T4TOTAL", "FREET4", "T3FREE", "THYROIDAB" in the last 72 hours. Anemia Panel: No results for input(s): "VITAMINB12", "FOLATE", "FERRITIN", "TIBC", "IRON", "RETICCTPCT" in the last 72 hours. Sepsis Labs: No results for input(s): "PROCALCITON", "LATICACIDVEN" in the last 168 hours.  Recent Results (from the past 240 hour(s))  Surgical pcr screen     Status: Abnormal   Collection Time:  06/02/23  4:13 AM   Specimen: Nasal Mucosa; Nasal Swab  Result Value Ref Range Status   MRSA, PCR NEGATIVE NEGATIVE Final   Staphylococcus aureus POSITIVE (A) NEGATIVE Final  Comment: (NOTE) The Xpert SA Assay (FDA approved for NASAL specimens in patients 39 years of age and older), is one component of a comprehensive surveillance program. It is not intended to diagnose infection nor to guide or monitor treatment. Performed at [facility], [address] [place], [place] [address]      Radiology Studies: No results found.  Scheduled Meds:  sodium chloride   Intravenous Once   acetaminophen  1,000 mg Oral Q6H   bisoprolol  5 mg Oral Daily   Chlorhexidine Gluconate Cloth  6 each Topical Q0600   docusate sodium  100 mg Oral BID   enoxaparin (LOVENOX) injection  40 mg Subcutaneous Q24H   feeding supplement  237 mL Oral BID BM   gabapentin  600 mg Oral BID   multivitamin with minerals  1 tablet Oral Daily   mupirocin ointment  1 Application Nasal BID   pantoprazole  40 mg Oral Daily   Continuous Infusions:   LOS: 4 days   Marguerita Merles, DO [facility] Available via Epic secure chat 7am-7pm After these hours, please refer to coverage provider listed on [email or web address] 06/06/2023, 6:07 PM

## 2023-06-07 ENCOUNTER — Other Ambulatory Visit (HOSPITAL_COMMUNITY): Payer: Self-pay

## 2023-06-07 DIAGNOSIS — I10 Essential (primary) hypertension: Secondary | ICD-10-CM | POA: Diagnosis not present

## 2023-06-07 DIAGNOSIS — I951 Orthostatic hypotension: Secondary | ICD-10-CM | POA: Diagnosis not present

## 2023-06-07 DIAGNOSIS — N1832 Chronic kidney disease, stage 3b: Secondary | ICD-10-CM | POA: Diagnosis not present

## 2023-06-07 DIAGNOSIS — S72002A Fracture of unspecified part of neck of left femur, initial encounter for closed fracture: Secondary | ICD-10-CM | POA: Diagnosis not present

## 2023-06-07 LAB — CBC WITH DIFFERENTIAL/PLATELET
Abs Immature Granulocytes: 0.03 10*3/uL (ref 0.00–0.07)
Basophils Absolute: 0 10*3/uL (ref 0.0–0.1)
Basophils Relative: 1 %
Eosinophils Absolute: 0.2 10*3/uL (ref 0.0–0.5)
Eosinophils Relative: 2 %
HCT: 23.3 % — ABNORMAL LOW (ref 39.0–52.0)
Hemoglobin: 7.9 g/dL — ABNORMAL LOW (ref 13.0–17.0)
Immature Granulocytes: 1 %
Lymphocytes Relative: 27 %
Lymphs Abs: 1.7 10*3/uL (ref 0.7–4.0)
MCH: 33.5 pg (ref 26.0–34.0)
MCHC: 33.9 g/dL (ref 30.0–36.0)
MCV: 98.7 fL (ref 80.0–100.0)
Monocytes Absolute: 0.6 10*3/uL (ref 0.1–1.0)
Monocytes Relative: 10 %
Neutro Abs: 3.7 10*3/uL (ref 1.7–7.7)
Neutrophils Relative %: 59 %
Platelets: 267 10*3/uL (ref 150–400)
RBC: 2.36 MIL/uL — ABNORMAL LOW (ref 4.22–5.81)
RDW: 13.2 % (ref 11.5–15.5)
WBC: 6.2 10*3/uL (ref 4.0–10.5)
nRBC: 0 % (ref 0.0–0.2)

## 2023-06-07 LAB — COMPREHENSIVE METABOLIC PANEL
ALT: 14 U/L (ref 0–44)
AST: 26 U/L (ref 15–41)
Albumin: 2.7 g/dL — ABNORMAL LOW (ref 3.5–5.0)
Alkaline Phosphatase: 48 U/L (ref 38–126)
Anion gap: 7 (ref 5–15)
BUN: 23 mg/dL (ref 8–23)
CO2: 27 mmol/L (ref 22–32)
Calcium: 8.3 mg/dL — ABNORMAL LOW (ref 8.9–10.3)
Chloride: 97 mmol/L — ABNORMAL LOW (ref 98–111)
Creatinine, Ser: 1.06 mg/dL (ref 0.61–1.24)
GFR, Estimated: >60 mL/min (ref >60)
Glucose, Bld: 100 mg/dL — ABNORMAL HIGH (ref 70–99)
Potassium: 4.3 mmol/L (ref 3.5–5.1)
Sodium: 131 mmol/L — ABNORMAL LOW (ref 135–145)
Total Bilirubin: 1.3 mg/dL — ABNORMAL HIGH (ref 0.3–1.2)
Total Protein: 5.3 g/dL — ABNORMAL LOW (ref 6.5–8.1)

## 2023-06-07 LAB — MAGNESIUM: Magnesium: 1.8 mg/dL (ref 1.7–2.4)

## 2023-06-07 LAB — PHOSPHORUS: Phosphorus: 3.4 mg/dL (ref 2.5–4.6)

## 2023-06-07 MED ORDER — MAGNESIUM SULFATE 2 GM/50ML IV SOLN
2.0000 g | Freq: Once | INTRAVENOUS | Status: AC
  Administered 2023-06-07: 2 g via INTRAVENOUS
  Filled 2023-06-07: qty 50

## 2023-06-07 MED ORDER — HYDRALAZINE HCL 10 MG PO TABS
10.0000 mg | ORAL_TABLET | Freq: Two times a day (BID) | ORAL | 0 refills | Status: DC
  Filled 2023-06-07 (×2): qty 60, 30d supply, fill #0

## 2023-06-07 MED ORDER — POLYETHYLENE GLYCOL 3350 17 GM/SCOOP PO POWD
17.0000 g | Freq: Every day | ORAL | 0 refills | Status: AC | PRN
  Filled 2023-06-07: qty 238, 14d supply, fill #0

## 2023-06-07 MED ORDER — LISINOPRIL 10 MG PO TABS
10.0000 mg | ORAL_TABLET | Freq: Every day | ORAL | 0 refills | Status: DC
  Filled 2023-06-07: qty 30, 30d supply, fill #0

## 2023-06-07 MED ORDER — ENSURE ENLIVE PO LIQD
237.0000 mL | Freq: Two times a day (BID) | ORAL | 12 refills | Status: DC
  Filled 2023-06-07: qty 237, 1d supply, fill #0

## 2023-06-07 MED ORDER — SENNOSIDES-DOCUSATE SODIUM 8.6-50 MG PO TABS
1.0000 | ORAL_TABLET | Freq: Every evening | ORAL | 0 refills | Status: DC | PRN
  Filled 2023-06-07: qty 30, 30d supply, fill #0

## 2023-06-07 MED ORDER — ADULT MULTIVITAMIN W/MINERALS CH
1.0000 | ORAL_TABLET | Freq: Every day | ORAL | 0 refills | Status: AC
  Filled 2023-06-07: qty 130, 130d supply, fill #0

## 2023-06-07 MED ORDER — AMLODIPINE BESYLATE 5 MG PO TABS
5.0000 mg | ORAL_TABLET | Freq: Every day | ORAL | 0 refills | Status: DC
  Filled 2023-06-07: qty 30, 30d supply, fill #0

## 2023-06-07 NOTE — Progress Notes (Signed)
Discharge instructions given. Patient verbalized understanding and all questions were answered.  ?

## 2023-06-07 NOTE — Progress Notes (Signed)
Mobility Specialist Progress Note   06/07/23 1017  Therapy Vitals  BP (!) 135/55 (Post ambulation)  Patient Position (if appropriate) Standing  Orthostatic Sitting  BP- Sitting 130/50  Pulse- Sitting 62  Mobility  Activity Ambulated with assistance in hallway;Stood at bedside  Level of Assistance Contact guard assist, steadying assist  Assistive Device Front wheel walker  Distance Ambulated (ft) 40 ft  Range of Motion/Exercises Active;All extremities  LLE Weight Bearing WBAT  Activity Response Tolerated well   Patient received in recliner and agreeable to participate. Obtained BP prior to and post ambulation, tolerating well without significant change. SBP and DBP increased 5 mmHg post ambulation. Stood with minimal HHA and ambulated min guard with slow steady gait. Returned to room without complaint or incident. Was left in recliner with all needs met, call bell in reach.   [place] Guiliana Shor, BS EXP Mobility Specialist Please contact via SecureChat or Rehab office at [phone]

## 2023-06-07 NOTE — Progress Notes (Signed)
[name] to be D/C'd Home with home health per MD order.  Discussed with the patient and all questions fully answered.  VSS, Skin clean, dry and intact without evidence of skin break down, no evidence of skin tears noted. IV catheter discontinued intact. Site without signs and symptoms of complications. Dressing and pressure applied.  An After Visit Summary was printed and given to the patient. Patient received prescriptions from [facility] pharmacy and rolling walker.  Patient instructed to return to ED, call 911, or call MD for any changes in condition.   Patient escorted via WC, and D/C home via private auto.  Pauletta Browns 06/07/2023 2:35 PM

## 2023-06-07 NOTE — Discharge Summary (Signed)
Physician Discharge Summary   Patient: Andrew Montgomery MRN: [id number] DOB: 1932/05/11  Admit date:     06/02/2023  Discharge date: 06/07/2023  Discharge Physician: Marguerita Merles, DO   PCP: Crist Fat, MD   Recommendations at discharge:  {Tip this will not be part of the note when signed- Example include specific recommendations for outpatient follow-up, pending tests to follow-up on. (Optional):26781}  ***  Discharge Diagnoses: Principal Problem:   Closed left hip fracture, initial encounter (HCC) Active Problems:   Essential hypertension   CKD stage 3b, GFR 30-44 ml/min (HCC)  Resolved Problems:   * No resolved hospital problems. [facility] Course: Andrew Montgomery is an 89 y.o. M with HTN, meningioma, CKD IIIa baseline 1.5 who presented with fall and left hip fracture.  He underwent cephalomedullary nail fixation and is postoperative day 2.  PT OT recommending home health.  Hospitalization has been complicated by orthostatic hypotension and syncope.  Also complicated by acute blood loss anemia as a postoperative cause and is getting typed and screened and transfused 1 unit PRBCs.  Continues remain orthostatic so we will give another bolus and continue monitor overnight and if stable with stable blood count can be discharged home with home health in the morning.  Assessment and Plan:   Left Hip Intertrochanteric Fracture   -Patient had cephalomedullary nail fixation and is Postoperative day 2 -Hemoglobin has dropped from 8.7 -> 7.3 -> 7.1. and will type and screen and transfuse 1 unit  -Patient had a syncopal episode secondary to orthostatsis currently being Treated with IV fluids -DVT prophylaxis with Enoxaparin -Orthopedic surgery following and continue pain regimen with Acetaminophen 1000 mg every 6h, gabapentin 600 g p.o. twice daily, hydromorphone 0.5 mg IV every 3 as needed for severe pain, methocarbamol 500 mg p.o. every 6 as needed for muscle spasms, and oxycodone 5 mg p.o.  every 4 as needed moderate pain -Bowel regimen with senna docusate 1 tab p.o. nightly as needed, MiraLAX 17 g p.o. daily as needed for mild constipation and docusate 100 mg p.o. twice daily -Orthopedic surgery recommending weightbearing as tolerated to left lower extremity with unrestricted range of motion and changing Mepilex as needed; they are recommending enoxaparin while hospitalized and changing to Eliquis 2.5 mg p.o. twice daily for 30 days for DVT prophylaxis. -PT OT recommending home health once medically stable   Hypertension -> Orthostatic Hypotension, persists -Hold all antihypertensives given patient is orthostatic positive treat with IV fluid bolus  -Continues to have soft BP and continuing Bisoprolol 5 mg po Daily -Had Orthostatic Hypotension so will order TED Hose and Abdominal Binder -Started normal saline at 75 MLS per hour but will stop and give a Bolus; Repeat Orthostatics in the AM -BP fluctuating and Orthostatics done and went from 144/56 Lying -> 159/68 Sitting -> 126/62 Standing and then 118/54 Standing at 3 Min -Continue to Monitor BP per Protocol -Last BP reading was 126/62   AKI on CKD 3a  -SCr is 1.65 on admission, was 1.39 in March 2024  -Baseline Cr is around 1.5 -BUN/Cr Trend: Recent Labs  Lab 06/02/23 0047 06/02/23 0610 06/02/23 2053 06/03/23 0252 06/04/23 0226 06/05/23 0117 06/06/23 0421  BUN 40* 32*  --  26* 31* 30* 26*  CREATININE 1.65* 1.39* 1.28* 1.23 1.40* 1.28* 1.03  -Ordered normal saline at 75 MLS per hour -Avoid Nephrotoxic Medications, Contrast Dyes, Hypotension and Dehydration to Ensure Adequate Renal Perfusion and will need to Renally Adjust Meds -Continue to Monitor and Trend  Renal Function carefully and repeat CMP in the AM   Hypomagnesemia -Patient's Mag Level Trend: Recent Labs  Lab 06/05/23 0117 06/06/23 0421  MG 1.5* 1.8  -Replete with IV Mag Sulfate 2 Grams  -Continue to Monitor and Replete as Necessary -Repeat Mag in the  AM   Postoperative Acute Blood Loss Anemia -Hgb/Hct Trend: Recent Labs  Lab 06/03/23 0252 06/04/23 0226 06/05/23 0117 06/05/23 1205 06/05/23 2336 06/06/23 0421 06/06/23 1253  HGB 8.7* 7.3* 7.1* 7.1* 8.8* 8.2* 8.0*  HCT 26.7* 21.8* 21.4* 21.4* 26.3* 24.7* 24.6*  MCV 101.5* 100.9* 101.4* 100.9*  --  98.0 99.2  -Typed and Screen and Transfuse 1 unit of pRBCs -Continue to Monitor for S/Sx of Bleeding; No overt bleeding noted -Repeat CBC in the AM and if remains stable and not orthostatic can likely D/C   Neuropathy  -Continue Gabapentin 600 mg po BID   GERD/GI Prophylaxis -Continue PPI with Pantoprazole 40 mg po Daily  Hypoalbuminemia -Patient's Albumin Trend: Recent Labs  Lab 06/02/23 0047 06/04/23 0226 06/05/23 0117 06/06/23 0421  ALBUMIN 3.6 2.8* 2.7* 2.7*  -Continue to Monitor and Trend and repeat CMP in the AM  Nutrition Documentation    Flowsheet Row ED to Hosp-Admission (Discharged) from 06/02/2023 in [facility]  Nutrition Problem Increased nutrient needs  Etiology post-op healing, hip fracture  Nutrition Goal Patient will meet greater than or equal to 90% of their needs  Interventions Ensure Enlive (each supplement provides 350kcal and 20 grams of protein), MVI, Liberalize Diet      Consultants: *** Procedures performed: ***  Disposition: {Plan; Disposition:26390} Diet recommendation:  Discharge Diet Orders (From admission, onward)     Start     Ordered   06/07/23 0000  Diet - low sodium heart healthy        06/07/23 1254           Cardiac diet DISCHARGE MEDICATION: Allergies as of 06/07/2023   No Known Allergies      Medication List     STOP taking these medications    chlorthalidone 25 MG tablet Commonly known as: HYGROTON   ibuprofen 200 MG tablet Commonly known as: ADVIL       TAKE these medications    acetaminophen 500 MG tablet Commonly known as: TYLENOL Take 2 tablets (1,000 mg total)  by mouth every 6 (six) hours as needed for headache, fever or mild pain.   amLODipine 5 MG tablet Commonly known as: NORVASC Take 1 tablet (5 mg total) by mouth daily. What changed:  medication strength how much to take   apixaban 2.5 MG Tabs tablet Commonly known as: Eliquis Take 1 tablet (2.5 mg total) by mouth 2 (two) times daily.   bisoprolol 5 MG tablet Commonly known as: ZEBETA TAKE 1 TABLET BY MOUTH DAILY   CertaVite/Antioxidants Tabs Take 1 tablet by mouth daily. Start taking on: June 08, 2023   diclofenac 75 MG EC tablet Commonly known as: VOLTAREN Take 1 tablet (75 mg total) by mouth 2 (two) times daily as needed. What changed:  when to take this reasons to take this   feeding supplement Liqd Take 237 mLs by mouth 2 (two) times daily between meals.   fluticasone 50 MCG/ACT nasal spray Commonly known as: FLONASE Place into both nostrils.   gabapentin 600 MG tablet Commonly known as: NEURONTIN Take 1 tablet (600 mg total) by mouth 2 (two) times daily. What changed: when to take this   hydrALAZINE 10 MG  tablet Commonly known as: APRESOLINE Take 1 tablet (10 mg total) by mouth 2 (two) times daily with a meal. What changed:  medication strength how much to take   lisinopril 10 MG tablet Commonly known as: ZESTRIL Take 1 tablet (10 mg total) by mouth daily. What changed:  medication strength how much to take   omeprazole 20 MG capsule Commonly known as: PRILOSEC TAKE 1 CAPSULE BY MOUTH DAILY   oxyCODONE 5 MG immediate release tablet Commonly known as: Oxy IR/ROXICODONE Take 1 tablet (5 mg total) by mouth every 4 (four) hours as needed for moderate pain.   polyethylene glycol powder 17 GM/SCOOP powder Commonly known as: GLYCOLAX/MIRALAX Take 17 g by mouth daily as needed for mild constipation.   Senexon-S 8.6-50 MG tablet Generic drug: senna-docusate Take 1 tablet by mouth at bedtime as needed for mild constipation.   Vitamin D 50 MCG (2000 UT)  Caps Take 2,000 Units by mouth daily.        Follow-up Information     Crist Fat, MD Follow up.   Specialty: Internal Medicine Contact information: [address] [place] [address] [phone]         Roby Lofts, MD. Schedule an appointment as soon as possible for a visit in 2 week(s).   Specialty: Orthopedic Surgery Why: for wound check and repeat x-rays Contact information: [address] [place] [address] [phone]                Discharge Exam: Filed Weights   06/02/23 0039 06/02/23 1438  Weight: 83 kg 83 kg   Vitals:   06/07/23 0824 06/07/23 1017  BP: (!) 133/51 (!) 135/55  Pulse: (!) 56   Resp: 16   Temp: 97.7 F (36.5 C)   SpO2: 98%    Examination: Physical Exam:  Constitutional: WN/WD, NAD and appears calm and comfortable Eyes: PERRL, lids and conjunctivae normal, sclerae anicteric  ENMT: External Ears, Nose appear normal. Grossly normal hearing. Mucous membranes are moist. Posterior pharynx clear of any exudate or lesions. Normal dentition.  Neck: Appears normal, supple, no cervical masses, normal ROM, no appreciable thyromegaly Respiratory: Clear to auscultation bilaterally, no wheezing, rales, rhonchi or crackles. Normal respiratory effort and patient is not tachypenic. No accessory muscle use.  Cardiovascular: RRR, no murmurs / rubs / gallops. S1 and S2 auscultated. No extremity edema. 2+ pedal pulses. No carotid bruits.  Abdomen: Soft, non-tender, non-distended. No masses palpated. No appreciable hepatosplenomegaly. Bowel sounds positive.  GU: Deferred. Musculoskeletal: No clubbing / cyanosis of digits/nails. No joint deformity upper and lower extremities. Good ROM, no contractures. Normal strength and muscle tone.  Skin: No rashes, lesions, ulcers. No induration; Warm and dry.  Neurologic: CN 2-12 grossly intact with no focal deficits. Sensation intact in all 4 Extremities, DTR normal. Strength 5/5 in all 4. Romberg  sign cerebellar reflexes not assessed.  Psychiatric: Normal judgment and insight. Alert and oriented x 3. Normal mood and appropriate affect.   Condition at discharge: {DC Condition:26389}  The results of significant diagnostics from this hospitalization (including imaging, microbiology, ancillary and laboratory) are listed below for reference.   Imaging Studies: DG FEMUR MIN 2 VIEWS LEFT  Result Date: 06/02/2023 CLINICAL DATA:  Elective surgery. EXAM: LEFT FEMUR 2 VIEWS COMPARISON:  Preoperative radiograph FINDINGS: Six fluoroscopic spot views of the left proximal femur obtained in the operating room. Intramedullary nail with trans trochanteric and distal locking screw fixation of intertrochanteric femur fracture. Fluoroscopy time 1  minutes 21 seconds. Dose 10.43 mGy. IMPRESSION: Fluoroscopic spot views during intertrochanteric femur fracture ORIF. Electronically Signed   By: Narda Rutherford M.D.   On: 06/02/2023 19:56   DG HIP PORT UNILAT W OR W/O PELVIS 1V LEFT  Result Date: 06/02/2023 CLINICAL DATA:  Status post IM nail. EXAM: DG HIP (WITH OR WITHOUT PELVIS) 1V PORT LEFT COMPARISON:  Preoperative radiograph earlier today FINDINGS: Femoral intramedullary nail with trans trochanteric and distal locking screw fixation traverse intertrochanteric femur fracture. Slightly improved fracture alignment from preoperative imaging. Recent postsurgical change includes air and edema in the soft tissues. IMPRESSION: ORIF of left intertrochanteric femur fracture. Electronically Signed   By: Narda Rutherford M.D.   On: 06/02/2023 18:12   DG C-Arm 1-60 Min-No Report  Result Date: 06/02/2023 Fluoroscopy was utilized by the requesting physician.  No radiographic interpretation.   DG Hip Unilat W or Wo Pelvis 2-3 Views Left  Result Date: 06/02/2023 CLINICAL DATA:  Left hip pain after fall EXAM: DG HIP (WITH OR WITHOUT PELVIS) 2-3V LEFT COMPARISON:  None Available. FINDINGS: Acute mildly displaced left  intertrochanteric fracture. Slight superolateral displacement of the dominant fracture fragment. Medial displacement of the lesser trochanter fracture. No dislocation. Degenerative changes pubic symphysis, both hips, SI joints and lower lumbar spine. IMPRESSION: Intertrochanteric left femur fracture. Electronically Signed   By: Minerva Fester M.D.   On: 06/02/2023 01:33   DG Chest 1 View  Result Date: 06/02/2023 CLINICAL DATA:  Fall at home. EXAM: CHEST  1 VIEW COMPARISON:  07/11/2005. FINDINGS: The heart size and mediastinal contours are within normal limits. There is atherosclerotic calcification of the aorta. No consolidation, effusion, or pneumothorax. Degenerative changes are noted in the thoracic spine. No acute osseous abnormality. IMPRESSION: No active disease. Electronically Signed   By: Thornell Sartorius M.D.   On: 06/02/2023 01:32   DG Ankle Complete Left  Result Date: 06/02/2023 CLINICAL DATA:  Fall, left ankle pain EXAM: LEFT ANKLE COMPLETE - 3+ VIEW COMPARISON:  None Available. FINDINGS: Normal alignment. No acute fracture or dislocation. Midfoot degenerative arthritis is partially visualized. Mild diffuse subcutaneous edema noted within the left ankle. No ankle effusion. IMPRESSION: 1. Mild diffuse subcutaneous edema. No acute fracture or dislocation. Electronically Signed   By: Helyn Numbers M.D.   On: 06/02/2023 01:32    Microbiology: Results for orders placed or performed during the hospital encounter of 06/02/23  Surgical pcr screen     Status: Abnormal   Collection Time: 06/02/23  4:13 AM   Specimen: Nasal Mucosa; Nasal Swab  Result Value Ref Range Status   MRSA, PCR NEGATIVE NEGATIVE Final   Staphylococcus aureus POSITIVE (A) NEGATIVE Final    Comment: (NOTE) The Xpert SA Assay (FDA approved for NASAL specimens in patients 70 years of age and older), is one component of a comprehensive surveillance program. It is not intended to diagnose infection nor to guide or monitor  treatment. Performed at [facility], [address], [place], [place] [address]    Labs: CBC: Recent Labs  Lab 06/04/23 0226 06/05/23 0117 06/05/23 1205 06/05/23 2336 06/06/23 0421 06/06/23 1253 06/07/23 0227  WBC 7.2 7.4 6.5  --  6.9 7.4 6.2  NEUTROABS 4.6  --  4.3  --  4.2 5.0 3.7  HGB 7.3* 7.1* 7.1* 8.8* 8.2* 8.0* 7.9*  HCT 21.8* 21.4* 21.4* 26.3* 24.7* 24.6* 23.3*  MCV 100.9* 101.4* 100.9*  --  98.0 99.2 98.7  PLT 220 225 251  --  265 267 267  Basic Metabolic Panel: Recent Labs  Lab 06/03/23 0252 06/04/23 0226 06/05/23 0117 06/06/23 0421 06/07/23 0227  NA 132* 132* 133* 132* 131*  K 4.8 4.5 4.3 4.3 4.3  CL 100 98 96* 99 97*  CO2 23 26 27 26 27   GLUCOSE 152* 114* 128* 99 100*  BUN 26* 31* 30* 26* 23  CREATININE 1.23 1.40* 1.28* 1.03 1.06  CALCIUM 8.3* 8.4* 8.3* 8.3* 8.3*  MG  --   --  1.5* 1.8 1.8  PHOS  --   --  2.7 3.0 3.4   Liver Function Tests: Recent Labs  Lab 06/02/23 0047 06/04/23 0226 06/05/23 0117 06/06/23 0421 06/07/23 0227  AST 24 20 22 24 26   ALT 15 8 9 11 14   ALKPHOS 52 38 39 45 48  BILITOT 0.4 0.3 0.4 1.2 1.3*  PROT 6.2* 5.0* 5.1* 5.4* 5.3*  ALBUMIN 3.6 2.8* 2.7* 2.7* 2.7*   CBG: No results for input(s): "GLUCAP" in the last 168 hours.  Discharge time spent: greater than 30 minutes.  Signed: Merlene Laughter, DO [facility] 06/07/2023

## 2023-06-08 DIAGNOSIS — I129 Hypertensive chronic kidney disease with stage 1 through stage 4 chronic kidney disease, or unspecified chronic kidney disease: Secondary | ICD-10-CM | POA: Diagnosis not present

## 2023-06-08 DIAGNOSIS — I951 Orthostatic hypotension: Secondary | ICD-10-CM | POA: Diagnosis not present

## 2023-06-08 DIAGNOSIS — N1832 Chronic kidney disease, stage 3b: Secondary | ICD-10-CM | POA: Diagnosis not present

## 2023-06-08 DIAGNOSIS — G4700 Insomnia, unspecified: Secondary | ICD-10-CM | POA: Diagnosis not present

## 2023-06-08 DIAGNOSIS — M5410 Radiculopathy, site unspecified: Secondary | ICD-10-CM | POA: Diagnosis not present

## 2023-06-08 DIAGNOSIS — M80052D Age-related osteoporosis with current pathological fracture, left femur, subsequent encounter for fracture with routine healing: Secondary | ICD-10-CM | POA: Diagnosis not present

## 2023-06-08 DIAGNOSIS — M1990 Unspecified osteoarthritis, unspecified site: Secondary | ICD-10-CM | POA: Diagnosis not present

## 2023-06-08 DIAGNOSIS — G629 Polyneuropathy, unspecified: Secondary | ICD-10-CM | POA: Diagnosis not present

## 2023-06-08 DIAGNOSIS — M25572 Pain in left ankle and joints of left foot: Secondary | ICD-10-CM | POA: Diagnosis not present

## 2023-06-12 DIAGNOSIS — I129 Hypertensive chronic kidney disease with stage 1 through stage 4 chronic kidney disease, or unspecified chronic kidney disease: Secondary | ICD-10-CM | POA: Diagnosis not present

## 2023-06-12 DIAGNOSIS — G4700 Insomnia, unspecified: Secondary | ICD-10-CM | POA: Diagnosis not present

## 2023-06-12 DIAGNOSIS — M25572 Pain in left ankle and joints of left foot: Secondary | ICD-10-CM | POA: Diagnosis not present

## 2023-06-12 DIAGNOSIS — M80052D Age-related osteoporosis with current pathological fracture, left femur, subsequent encounter for fracture with routine healing: Secondary | ICD-10-CM | POA: Diagnosis not present

## 2023-06-12 DIAGNOSIS — I951 Orthostatic hypotension: Secondary | ICD-10-CM | POA: Diagnosis not present

## 2023-06-12 DIAGNOSIS — M1990 Unspecified osteoarthritis, unspecified site: Secondary | ICD-10-CM | POA: Diagnosis not present

## 2023-06-12 DIAGNOSIS — G629 Polyneuropathy, unspecified: Secondary | ICD-10-CM | POA: Diagnosis not present

## 2023-06-12 DIAGNOSIS — N1832 Chronic kidney disease, stage 3b: Secondary | ICD-10-CM | POA: Diagnosis not present

## 2023-06-12 DIAGNOSIS — M5410 Radiculopathy, site unspecified: Secondary | ICD-10-CM | POA: Diagnosis not present

## 2023-06-14 DIAGNOSIS — M1990 Unspecified osteoarthritis, unspecified site: Secondary | ICD-10-CM | POA: Diagnosis not present

## 2023-06-14 DIAGNOSIS — M25572 Pain in left ankle and joints of left foot: Secondary | ICD-10-CM | POA: Diagnosis not present

## 2023-06-14 DIAGNOSIS — M80052D Age-related osteoporosis with current pathological fracture, left femur, subsequent encounter for fracture with routine healing: Secondary | ICD-10-CM | POA: Diagnosis not present

## 2023-06-14 DIAGNOSIS — G4700 Insomnia, unspecified: Secondary | ICD-10-CM | POA: Diagnosis not present

## 2023-06-14 DIAGNOSIS — I129 Hypertensive chronic kidney disease with stage 1 through stage 4 chronic kidney disease, or unspecified chronic kidney disease: Secondary | ICD-10-CM | POA: Diagnosis not present

## 2023-06-14 DIAGNOSIS — M5410 Radiculopathy, site unspecified: Secondary | ICD-10-CM | POA: Diagnosis not present

## 2023-06-14 DIAGNOSIS — I951 Orthostatic hypotension: Secondary | ICD-10-CM | POA: Diagnosis not present

## 2023-06-14 DIAGNOSIS — N1832 Chronic kidney disease, stage 3b: Secondary | ICD-10-CM | POA: Diagnosis not present

## 2023-06-14 DIAGNOSIS — G629 Polyneuropathy, unspecified: Secondary | ICD-10-CM | POA: Diagnosis not present

## 2023-06-17 DIAGNOSIS — I129 Hypertensive chronic kidney disease with stage 1 through stage 4 chronic kidney disease, or unspecified chronic kidney disease: Secondary | ICD-10-CM | POA: Diagnosis not present

## 2023-06-17 DIAGNOSIS — G4700 Insomnia, unspecified: Secondary | ICD-10-CM | POA: Diagnosis not present

## 2023-06-17 DIAGNOSIS — M5410 Radiculopathy, site unspecified: Secondary | ICD-10-CM | POA: Diagnosis not present

## 2023-06-17 DIAGNOSIS — M1990 Unspecified osteoarthritis, unspecified site: Secondary | ICD-10-CM | POA: Diagnosis not present

## 2023-06-17 DIAGNOSIS — M80052D Age-related osteoporosis with current pathological fracture, left femur, subsequent encounter for fracture with routine healing: Secondary | ICD-10-CM | POA: Diagnosis not present

## 2023-06-17 DIAGNOSIS — I951 Orthostatic hypotension: Secondary | ICD-10-CM | POA: Diagnosis not present

## 2023-06-17 DIAGNOSIS — N1832 Chronic kidney disease, stage 3b: Secondary | ICD-10-CM | POA: Diagnosis not present

## 2023-06-17 DIAGNOSIS — M25572 Pain in left ankle and joints of left foot: Secondary | ICD-10-CM | POA: Diagnosis not present

## 2023-06-17 DIAGNOSIS — G629 Polyneuropathy, unspecified: Secondary | ICD-10-CM | POA: Diagnosis not present

## 2023-06-18 ENCOUNTER — Ambulatory Visit: Payer: Medicare HMO | Admitting: Internal Medicine

## 2023-06-18 ENCOUNTER — Encounter: Payer: Self-pay | Admitting: Internal Medicine

## 2023-06-18 VITALS — BP 138/80 | HR 60 | Temp 98.1°F | Resp 16 | Ht 69.0 in | Wt 183.2 lb

## 2023-06-18 DIAGNOSIS — S7292XD Unspecified fracture of left femur, subsequent encounter for closed fracture with routine healing: Secondary | ICD-10-CM | POA: Diagnosis not present

## 2023-06-18 DIAGNOSIS — D649 Anemia, unspecified: Secondary | ICD-10-CM | POA: Diagnosis not present

## 2023-06-18 DIAGNOSIS — I10 Essential (primary) hypertension: Secondary | ICD-10-CM | POA: Diagnosis not present

## 2023-06-18 DIAGNOSIS — M8000XD Age-related osteoporosis with current pathological fracture, unspecified site, subsequent encounter for fracture with routine healing: Secondary | ICD-10-CM | POA: Diagnosis not present

## 2023-06-18 MED ORDER — LISINOPRIL 10 MG PO TABS: 10.0000 mg | ORAL_TABLET | Freq: Every day | ORAL | 3 refills | Status: DC

## 2023-06-18 MED ORDER — AMLODIPINE BESYLATE 5 MG PO TABS: 5.0000 mg | ORAL_TABLET | Freq: Every day | ORAL | 3 refills | Status: DC

## 2023-06-18 MED ORDER — HYDRALAZINE HCL 25 MG PO TABS: 25.0000 mg | ORAL_TABLET | Freq: Two times a day (BID) | ORAL | 3 refills | Status: DC

## 2023-06-18 NOTE — Assessment & Plan Note (Signed)
They did change his BP meds during his hospitalization.  They decreased his lisinopril from 40mg  daily to 10mg  daily and decreased his amlodipine from 10mg  to 5mg  daily.  They kept his hydralazine at the same dose of 25mg  BID and discontinued his chlorthalidone.

## 2023-06-18 NOTE — Assessment & Plan Note (Signed)
I had a discussion with him regarding his osteoporosis.  He is now willing to let me get a repeat bone density.  His calcium  and Vit D levels were normal.

## 2023-06-18 NOTE — Assessment & Plan Note (Signed)
He is getting therapies at home and he states his pain is controlled.  He has a followup appointment with ortho on 06/25/2023.

## 2023-06-18 NOTE — Progress Notes (Addendum)
Office Visit  Subjective   Patient ID: Andrew Montgomery   DOB: 10-Apr-1932   Age: 89 y.o.   MRN: [id number]   Chief Complaint Chief Complaint  Patient presents with   Follow-up     History of Present Illness Andrew Montgomery is an 89 y.o. male who comes in today for a transition of care where he was admitted to [facility] from 06/02/2023 until 06/07/2023 where he had a left femur fracture.  The patient was taking out some trash where he had a ground-level fall and sustained a left intertrochanteric femur fracture seen on plain film xrays.  He states he got tangled up in the door and had an accidental fall.  There was no chest pain, palpitations, dizziness or syncope.  He has been living independently.  He underwent cephalomedullary nail fixation done on 06/03/2023.  His hospitalization was complicated by orthostatic hypotension and syncope as well as acute blood loss anemia.  His HgB on 6/12 went down to 7.1 where he received 1 Unit of blood.  His discharge hemoglobin was 7.9.  He has known CKD with baseline creatinine around 1.5.  He was noted on 6/9 to have a creatinine of 1.65 and on discharge his creatinine was 1.  The patient subsequently improved and was no longer orthostatic on repeat measurements.  He was placed on apixaban 2.5mg  BID postoperatively.  He syncopal episode due to orthostasis was treated with IVF's.  He was sent home on prn oxycodone but he did not take it today.  He states his pain is controlled.  He was sent home with homehealth therapies which he states are coming to the house.    The patient is a 89 year old Caucasian/White male who presents for followup of his osteoporosis. The patient does have a history of osteoporosis and a vertebral compression fracture. He had a bone density performed on 12/2016 which showed a t-score of -2.1. However, he has had a vertebral compression fracture which indicates he has osteoporosis. We started him on fosamax which he is taking once a week. His Vit D  level was normal on testing.Marland Kitchen He has a history of spinal fractures. He denies a family history of osteoporosis. Current risk factors: alcohol consumption of more that 7 ounces per week. He denies the following: smoking, diabetes mellitus, high caffeine intake, daily prednisone use, hyperthyroidism, bowel resection, and gastric resection. He states he does not exercise routinely. A bone mineral density study was performed as described above in 12/2016 with a t-score of -2.1. We did repeat a bone density in 02/22/2021 and this showed a t-score of -2.3. I had a discussion with him and we stopped his Fosamax in 01/2022.  He did not want to do another bone density when I saw him in 02/2023 for an annual exam.  We did check his Vit D levels at that time and they were normal with 53.1 value.      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)    Osteoarthritis    Osteoporosis    PSA, INCREASED 01/05/2010   Radiculopathy with lower extremity symptoms    Seasonal allergies    Vertebral compression fracture (HCC)  Vitamin B12 deficiency    Vitamin D deficiency      Allergies No Known Allergies   Medications  Current Outpatient Medications:    acetaminophen (TYLENOL) 500 MG tablet, Take 2 tablets (1,000 mg total) by mouth every 6 (six) hours as needed for headache, fever or mild pain., Disp: , Rfl: 0   amLODipine (NORVASC) 5 MG tablet, Take 1 tablet (5 mg total) by mouth daily., Disp: 30 tablet, Rfl: 0   apixaban (ELIQUIS) 2.5 MG TABS tablet, Take 1 tablet (2.5 mg total) by mouth 2 (two) times daily., Disp: 60 tablet, Rfl: 0   bisoprolol (ZEBETA) 5 MG tablet, TAKE 1 TABLET BY MOUTH DAILY, Disp: 100 tablet, Rfl:  2   Cholecalciferol (VITAMIN D) 50 MCG (2000 UT) CAPS, Take 2,000 Units by mouth daily., Disp: , Rfl:    diclofenac (VOLTAREN) 75 MG EC tablet, Take 1 tablet (75 mg total) by mouth 2 (two) times daily as needed. (Patient taking differently: Take 75 mg by mouth as needed for mild pain or moderate pain.), Disp: 60 tablet, Rfl: 5   feeding supplement (ENSURE ENLIVE / ENSURE PLUS) LIQD, Take 237 mLs by mouth 2 (two) times daily between meals., Disp: 237 mL, Rfl: 12   fluticasone (FLONASE) 50 MCG/ACT nasal spray, Place into both nostrils. (Patient not taking: Reported on 06/02/2023), Disp: , Rfl:    gabapentin (NEURONTIN) 600 MG tablet, Take 1 tablet (600 mg total) by mouth 2 (two) times daily. (Patient taking differently: Take 600 mg by mouth at bedtime.), Disp: 180 tablet, Rfl: 1   hydrALAZINE (APRESOLINE) 10 MG tablet, Take 1 tablet (10 mg total) by mouth 2 (two) times daily with a meal., Disp: 60 tablet, Rfl: 0   lisinopril (ZESTRIL) 10 MG tablet, Take 1 tablet (10 mg total) by mouth daily., Disp: 30 tablet, Rfl: 0   Multiple Vitamin (MULTIVITAMIN WITH MINERALS) TABS tablet, Take 1 tablet by mouth daily., Disp: 130 tablet, Rfl: 0   omeprazole (PRILOSEC) 20 MG capsule, TAKE 1 CAPSULE BY MOUTH DAILY, Disp: 100 capsule, Rfl: 2   oxyCODONE (OXY IR/ROXICODONE) 5 MG immediate release tablet, Take 1 tablet (5 mg total) by mouth every 4 (four) hours as needed for moderate pain., Disp: 30 tablet, Rfl: 0   polyethylene glycol powder (GLYCOLAX/MIRALAX) 17 GM/SCOOP powder, Take 17 g by mouth daily as needed for mild constipation., Disp: 238 g, Rfl: 0   senna-docusate (SENOKOT-S) 8.6-50 MG tablet, Take 1 tablet by mouth at bedtime as needed for mild constipation., Disp: 30 tablet, Rfl: 0   Review of Systems Review of Systems  Constitutional:  Negative for chills and fever.  Respiratory:  Negative for cough and shortness of breath.   Cardiovascular:  Positive for leg swelling. Negative for chest pain and  palpitations.  Gastrointestinal:  Positive for constipation. Negative for abdominal pain, diarrhea, nausea and vomiting.  Musculoskeletal:  Negative for back pain, myalgias and neck pain.  Neurological:  Negative for dizziness, weakness and headaches.       Objective:    Vitals BP 138/80   Pulse 60   Temp 98.1 F (36.7 C)   Resp 16   Ht 5\' 9"  (1.753 m)   Wt 183 lb 3.2 oz (83.1 kg)   SpO2 97%   BMI 27.05 kg/m    Physical Examination Physical Exam Constitutional:      Appearance: Normal appearance. He is not ill-appearing.  Cardiovascular:     Rate and Rhythm: Normal rate and regular rhythm.  Pulses: Normal pulses.     Heart sounds: No murmur heard.    No friction rub. No gallop.  Pulmonary:     Effort: Pulmonary effort is normal. No respiratory distress.     Breath sounds: No wheezing, rhonchi or rales.  Abdominal:     General: Abdomen is flat. Bowel sounds are normal. There is no distension.     Palpations: Abdomen is soft.     Tenderness: There is no abdominal tenderness.  Musculoskeletal:     Right lower leg: No edema.     Left lower leg: Edema present.  Skin:    General: Skin is warm and dry.     Findings: No rash.  Neurological:     Mental Status: He is alert.        Assessment & Plan:   Age-related osteoporosis with current pathological fracture I had a discussion with him regarding his osteoporosis.  He is now willing to let me get a repeat bone density.  His calcium  and Vit D levels were normal.  Closed fracture of left femur with routine healing He is getting therapies at home and he states his pain is controlled.  He has a followup appointment with ortho on 06/25/2023.  Anemia His HgB was 12.3 in 02/2023.  He went down postoperatively to a HgB of 7.1 where he was transfused.  I am going to repeat his CBC today and obtain iron studies and I gave him a FIT test to check for occult blood in his stools.  This was checked previously in 2022 and was  negative.  Essential hypertension They did change his BP meds during his hospitalization.  They decreased his lisinopril from 40mg  daily to 10mg  daily and decreased his amlodipine from 10mg  to 5mg  daily.  They kept his hydralazine at the same dose of 25mg  BID and discontinued his chlorthalidone.    Return in about 3 months (around 09/18/2023).   Crist Fat, MD

## 2023-06-18 NOTE — Addendum Note (Signed)
Addended by: Crist Fat on: 06/18/2023 11:05 AM   Modules accepted: Orders

## 2023-06-18 NOTE — Assessment & Plan Note (Signed)
His HgB was 12.3 in 02/2023.  He went down postoperatively to a HgB of 7.1 where he was transfused.  I am going to repeat his CBC today and obtain iron studies and I gave him a FIT test to check for occult blood in his stools.  This was checked previously in 2022 and was negative.

## 2023-06-19 LAB — CBC WITH DIFFERENTIAL/PLATELET
Basophils Absolute: 0.1 10*3/uL (ref 0.0–0.2)
Basos: 1 %
EOS (ABSOLUTE): 0.1 10*3/uL (ref 0.0–0.4)
Eos: 1 %
Hematocrit: 27.6 % — ABNORMAL LOW (ref 37.5–51.0)
Hemoglobin: 9 g/dL — ABNORMAL LOW (ref 13.0–17.7)
Immature Grans (Abs): 0.1 10*3/uL (ref 0.0–0.1)
Immature Granulocytes: 1 %
Lymphocytes Absolute: 1.2 10*3/uL (ref 0.7–3.1)
Lymphs: 16 %
MCH: 32.1 pg (ref 26.6–33.0)
MCHC: 32.6 g/dL (ref 31.5–35.7)
MCV: 99 fL — ABNORMAL HIGH (ref 79–97)
Monocytes Absolute: 0.5 10*3/uL (ref 0.1–0.9)
Monocytes: 6 %
Neutrophils Absolute: 5.7 10*3/uL (ref 1.4–7.0)
Neutrophils: 75 %
Platelets: 474 10*3/uL — ABNORMAL HIGH (ref 150–450)
RBC: 2.8 x10E6/uL — ABNORMAL LOW (ref 4.14–5.80)
RDW: 12.3 % (ref 11.6–15.4)
WBC: 7.6 10*3/uL (ref 3.4–10.8)

## 2023-06-19 LAB — IRON AND TIBC
Iron Saturation: 18 % (ref 15–55)
Iron: 47 ug/dL (ref 38–169)
Total Iron Binding Capacity: 260 ug/dL (ref 250–450)
UIBC: 213 ug/dL (ref 111–343)

## 2023-06-19 LAB — RETICULOCYTES: Retic Ct Pct: 2.6 % (ref 0.6–2.6)

## 2023-06-19 LAB — FERRITIN: Ferritin: 763 ng/mL — ABNORMAL HIGH (ref 30–400)

## 2023-06-21 DIAGNOSIS — N1832 Chronic kidney disease, stage 3b: Secondary | ICD-10-CM | POA: Diagnosis not present

## 2023-06-21 DIAGNOSIS — G629 Polyneuropathy, unspecified: Secondary | ICD-10-CM | POA: Diagnosis not present

## 2023-06-21 DIAGNOSIS — M1990 Unspecified osteoarthritis, unspecified site: Secondary | ICD-10-CM | POA: Diagnosis not present

## 2023-06-21 DIAGNOSIS — I951 Orthostatic hypotension: Secondary | ICD-10-CM | POA: Diagnosis not present

## 2023-06-21 DIAGNOSIS — M25572 Pain in left ankle and joints of left foot: Secondary | ICD-10-CM | POA: Diagnosis not present

## 2023-06-21 DIAGNOSIS — M5410 Radiculopathy, site unspecified: Secondary | ICD-10-CM | POA: Diagnosis not present

## 2023-06-21 DIAGNOSIS — G4700 Insomnia, unspecified: Secondary | ICD-10-CM | POA: Diagnosis not present

## 2023-06-21 DIAGNOSIS — I129 Hypertensive chronic kidney disease with stage 1 through stage 4 chronic kidney disease, or unspecified chronic kidney disease: Secondary | ICD-10-CM | POA: Diagnosis not present

## 2023-06-21 DIAGNOSIS — M80052D Age-related osteoporosis with current pathological fracture, left femur, subsequent encounter for fracture with routine healing: Secondary | ICD-10-CM | POA: Diagnosis not present

## 2023-06-24 ENCOUNTER — Other Ambulatory Visit: Payer: Self-pay

## 2023-06-24 ENCOUNTER — Ambulatory Visit: Payer: Medicare HMO

## 2023-06-24 DIAGNOSIS — M5410 Radiculopathy, site unspecified: Secondary | ICD-10-CM | POA: Diagnosis not present

## 2023-06-24 DIAGNOSIS — D649 Anemia, unspecified: Secondary | ICD-10-CM

## 2023-06-24 DIAGNOSIS — M1990 Unspecified osteoarthritis, unspecified site: Secondary | ICD-10-CM | POA: Diagnosis not present

## 2023-06-24 DIAGNOSIS — M25572 Pain in left ankle and joints of left foot: Secondary | ICD-10-CM | POA: Diagnosis not present

## 2023-06-24 DIAGNOSIS — Z1211 Encounter for screening for malignant neoplasm of colon: Secondary | ICD-10-CM

## 2023-06-24 DIAGNOSIS — N1832 Chronic kidney disease, stage 3b: Secondary | ICD-10-CM | POA: Diagnosis not present

## 2023-06-24 DIAGNOSIS — G4700 Insomnia, unspecified: Secondary | ICD-10-CM | POA: Diagnosis not present

## 2023-06-24 DIAGNOSIS — G629 Polyneuropathy, unspecified: Secondary | ICD-10-CM | POA: Diagnosis not present

## 2023-06-24 DIAGNOSIS — I129 Hypertensive chronic kidney disease with stage 1 through stage 4 chronic kidney disease, or unspecified chronic kidney disease: Secondary | ICD-10-CM | POA: Diagnosis not present

## 2023-06-24 DIAGNOSIS — M80052D Age-related osteoporosis with current pathological fracture, left femur, subsequent encounter for fracture with routine healing: Secondary | ICD-10-CM | POA: Diagnosis not present

## 2023-06-24 DIAGNOSIS — I951 Orthostatic hypotension: Secondary | ICD-10-CM | POA: Diagnosis not present

## 2023-06-25 ENCOUNTER — Ambulatory Visit: Payer: Medicare HMO

## 2023-06-25 DIAGNOSIS — E538 Deficiency of other specified B group vitamins: Secondary | ICD-10-CM | POA: Diagnosis not present

## 2023-06-25 DIAGNOSIS — S72142D Displaced intertrochanteric fracture of left femur, subsequent encounter for closed fracture with routine healing: Secondary | ICD-10-CM | POA: Diagnosis not present

## 2023-06-25 LAB — SPECIMEN STATUS REPORT

## 2023-06-25 LAB — FECAL OCCULT BLOOD, IMMUNOCHEMICAL: Fecal Occult Bld: NEGATIVE

## 2023-06-25 MED ORDER — CYANOCOBALAMIN 1000 MCG/ML IJ SOLN
1000.0000 ug | Freq: Once | INTRAMUSCULAR | Status: AC
Start: 2023-06-25 — End: 2023-06-25
  Administered 2023-06-25: 1000 ug via INTRAMUSCULAR

## 2023-06-25 NOTE — Progress Notes (Signed)
Vitamin B12 injection

## 2023-06-26 DIAGNOSIS — M25572 Pain in left ankle and joints of left foot: Secondary | ICD-10-CM | POA: Diagnosis not present

## 2023-06-26 DIAGNOSIS — M5410 Radiculopathy, site unspecified: Secondary | ICD-10-CM | POA: Diagnosis not present

## 2023-06-26 DIAGNOSIS — I951 Orthostatic hypotension: Secondary | ICD-10-CM | POA: Diagnosis not present

## 2023-06-26 DIAGNOSIS — M1990 Unspecified osteoarthritis, unspecified site: Secondary | ICD-10-CM | POA: Diagnosis not present

## 2023-06-26 DIAGNOSIS — N1832 Chronic kidney disease, stage 3b: Secondary | ICD-10-CM | POA: Diagnosis not present

## 2023-06-26 DIAGNOSIS — G4700 Insomnia, unspecified: Secondary | ICD-10-CM | POA: Diagnosis not present

## 2023-06-26 DIAGNOSIS — G629 Polyneuropathy, unspecified: Secondary | ICD-10-CM | POA: Diagnosis not present

## 2023-06-26 DIAGNOSIS — I129 Hypertensive chronic kidney disease with stage 1 through stage 4 chronic kidney disease, or unspecified chronic kidney disease: Secondary | ICD-10-CM | POA: Diagnosis not present

## 2023-06-26 DIAGNOSIS — M80052D Age-related osteoporosis with current pathological fracture, left femur, subsequent encounter for fracture with routine healing: Secondary | ICD-10-CM | POA: Diagnosis not present

## 2023-07-01 DIAGNOSIS — M80052D Age-related osteoporosis with current pathological fracture, left femur, subsequent encounter for fracture with routine healing: Secondary | ICD-10-CM | POA: Diagnosis not present

## 2023-07-01 DIAGNOSIS — M1990 Unspecified osteoarthritis, unspecified site: Secondary | ICD-10-CM | POA: Diagnosis not present

## 2023-07-01 DIAGNOSIS — G629 Polyneuropathy, unspecified: Secondary | ICD-10-CM | POA: Diagnosis not present

## 2023-07-01 DIAGNOSIS — N1832 Chronic kidney disease, stage 3b: Secondary | ICD-10-CM | POA: Diagnosis not present

## 2023-07-01 DIAGNOSIS — M5410 Radiculopathy, site unspecified: Secondary | ICD-10-CM | POA: Diagnosis not present

## 2023-07-01 DIAGNOSIS — I129 Hypertensive chronic kidney disease with stage 1 through stage 4 chronic kidney disease, or unspecified chronic kidney disease: Secondary | ICD-10-CM | POA: Diagnosis not present

## 2023-07-01 DIAGNOSIS — I951 Orthostatic hypotension: Secondary | ICD-10-CM | POA: Diagnosis not present

## 2023-07-01 DIAGNOSIS — M25572 Pain in left ankle and joints of left foot: Secondary | ICD-10-CM | POA: Diagnosis not present

## 2023-07-01 DIAGNOSIS — G4700 Insomnia, unspecified: Secondary | ICD-10-CM | POA: Diagnosis not present

## 2023-07-02 DIAGNOSIS — I951 Orthostatic hypotension: Secondary | ICD-10-CM | POA: Diagnosis not present

## 2023-07-02 DIAGNOSIS — M5410 Radiculopathy, site unspecified: Secondary | ICD-10-CM | POA: Diagnosis not present

## 2023-07-02 DIAGNOSIS — M25572 Pain in left ankle and joints of left foot: Secondary | ICD-10-CM | POA: Diagnosis not present

## 2023-07-02 DIAGNOSIS — N1832 Chronic kidney disease, stage 3b: Secondary | ICD-10-CM | POA: Diagnosis not present

## 2023-07-02 DIAGNOSIS — I129 Hypertensive chronic kidney disease with stage 1 through stage 4 chronic kidney disease, or unspecified chronic kidney disease: Secondary | ICD-10-CM | POA: Diagnosis not present

## 2023-07-02 DIAGNOSIS — G4700 Insomnia, unspecified: Secondary | ICD-10-CM | POA: Diagnosis not present

## 2023-07-02 DIAGNOSIS — M80052D Age-related osteoporosis with current pathological fracture, left femur, subsequent encounter for fracture with routine healing: Secondary | ICD-10-CM | POA: Diagnosis not present

## 2023-07-02 DIAGNOSIS — G629 Polyneuropathy, unspecified: Secondary | ICD-10-CM | POA: Diagnosis not present

## 2023-07-02 DIAGNOSIS — M1990 Unspecified osteoarthritis, unspecified site: Secondary | ICD-10-CM | POA: Diagnosis not present

## 2023-07-07 DIAGNOSIS — S7292XD Unspecified fracture of left femur, subsequent encounter for closed fracture with routine healing: Secondary | ICD-10-CM | POA: Diagnosis not present

## 2023-07-07 DIAGNOSIS — M8000XD Age-related osteoporosis with current pathological fracture, unspecified site, subsequent encounter for fracture with routine healing: Secondary | ICD-10-CM

## 2023-07-07 DIAGNOSIS — I10 Essential (primary) hypertension: Secondary | ICD-10-CM

## 2023-07-07 DIAGNOSIS — D649 Anemia, unspecified: Secondary | ICD-10-CM | POA: Diagnosis not present

## 2023-07-08 DIAGNOSIS — N1832 Chronic kidney disease, stage 3b: Secondary | ICD-10-CM | POA: Diagnosis not present

## 2023-07-08 DIAGNOSIS — I129 Hypertensive chronic kidney disease with stage 1 through stage 4 chronic kidney disease, or unspecified chronic kidney disease: Secondary | ICD-10-CM | POA: Diagnosis not present

## 2023-07-08 DIAGNOSIS — G629 Polyneuropathy, unspecified: Secondary | ICD-10-CM | POA: Diagnosis not present

## 2023-07-08 DIAGNOSIS — I951 Orthostatic hypotension: Secondary | ICD-10-CM | POA: Diagnosis not present

## 2023-07-08 DIAGNOSIS — M80052D Age-related osteoporosis with current pathological fracture, left femur, subsequent encounter for fracture with routine healing: Secondary | ICD-10-CM | POA: Diagnosis not present

## 2023-07-08 DIAGNOSIS — G4700 Insomnia, unspecified: Secondary | ICD-10-CM | POA: Diagnosis not present

## 2023-07-08 DIAGNOSIS — M5410 Radiculopathy, site unspecified: Secondary | ICD-10-CM | POA: Diagnosis not present

## 2023-07-08 DIAGNOSIS — M1990 Unspecified osteoarthritis, unspecified site: Secondary | ICD-10-CM | POA: Diagnosis not present

## 2023-07-08 DIAGNOSIS — M25572 Pain in left ankle and joints of left foot: Secondary | ICD-10-CM | POA: Diagnosis not present

## 2023-07-10 DIAGNOSIS — G4700 Insomnia, unspecified: Secondary | ICD-10-CM | POA: Diagnosis not present

## 2023-07-10 DIAGNOSIS — N1832 Chronic kidney disease, stage 3b: Secondary | ICD-10-CM | POA: Diagnosis not present

## 2023-07-10 DIAGNOSIS — I129 Hypertensive chronic kidney disease with stage 1 through stage 4 chronic kidney disease, or unspecified chronic kidney disease: Secondary | ICD-10-CM | POA: Diagnosis not present

## 2023-07-10 DIAGNOSIS — M1990 Unspecified osteoarthritis, unspecified site: Secondary | ICD-10-CM | POA: Diagnosis not present

## 2023-07-10 DIAGNOSIS — M80052D Age-related osteoporosis with current pathological fracture, left femur, subsequent encounter for fracture with routine healing: Secondary | ICD-10-CM | POA: Diagnosis not present

## 2023-07-10 DIAGNOSIS — G629 Polyneuropathy, unspecified: Secondary | ICD-10-CM | POA: Diagnosis not present

## 2023-07-10 DIAGNOSIS — M5410 Radiculopathy, site unspecified: Secondary | ICD-10-CM | POA: Diagnosis not present

## 2023-07-10 DIAGNOSIS — M25572 Pain in left ankle and joints of left foot: Secondary | ICD-10-CM | POA: Diagnosis not present

## 2023-07-10 DIAGNOSIS — I951 Orthostatic hypotension: Secondary | ICD-10-CM | POA: Diagnosis not present

## 2023-07-11 ENCOUNTER — Ambulatory Visit: Payer: Medicare HMO | Admitting: Internal Medicine

## 2023-07-11 VITALS — BP 146/62 | HR 60 | Temp 97.3°F | Resp 18 | Ht 69.0 in | Wt 188.2 lb

## 2023-07-11 DIAGNOSIS — M7989 Other specified soft tissue disorders: Secondary | ICD-10-CM | POA: Insufficient documentation

## 2023-07-11 DIAGNOSIS — R600 Localized edema: Secondary | ICD-10-CM | POA: Diagnosis not present

## 2023-07-11 NOTE — Progress Notes (Signed)
Office Visit  Subjective   Patient ID: Andrew Montgomery   DOB: May 30, 1932   Age: 89 y.o.   MRN: [id number]   Chief Complaint Chief Complaint  Patient presents with   Knee Pain   Leg Swelling     History of Present Illness Andrew Montgomery is an 89 y.o. male who comes in today for an acute visit as he has swelling of his lower left leg from the knee down.  Again, he was hospitalized from 06/02/2023 until 06/07/2023 where he had a left femur fracture.  He sustained a left intertrochanteric femur fracture and underwent cephalomedullary nail fixation done on 06/03/2023.  His hospitalization was complicated by orthostatic hypotension and syncope as well as acute blood loss anemia.  His HgB on 6/12 went down to 7.1 where he received 1 Unit of blood.  His discharge hemoglobin was 7.9.  He has known CKD with baseline creatinine around 1.5.  He was noted on 6/9 to have a creatinine of 1.65 and on discharge his creatinine was 1.  The patient subsequently improved and was no longer orthostatic on repeat measurements.  He was placed on apixaban 2.5mg  BID postoperatively to take for 30 days.  He states he began having swelling of this leg about 2 weeks after he was discharged from the hospital.  He was sent home on apixaban which they wanted him to take for 30 days but he ran out of it some times last week (about 5-6 days) ago.  He is having some pain in his left calf.  His right leg has had an ankle injury in the past and he does get swelling of this leg but it usually goes down but the left leg never has problems.  Homehealth saw him and told him to come here to get a fluid pill to help with his swelling.  Today, he denies any fevers, chills, chest pain, palpitations, SOB or other problems.     Past Medical History Past Medical History:  Diagnosis Date   BPH (benign prostatic hyperplasia)    COLONIC POLYPS, HX OF 01/05/2010   Depression 07/06/2013   DJD (degenerative joint disease)    Essential hypertension     GERD (gastroesophageal reflux disease)    Insomnia 07/03/2011   Meningioma determined by biopsy of optic nerve (HCC)    Osteoarthritis    Osteoporosis    PSA, INCREASED 01/05/2010   Radiculopathy with lower extremity symptoms    Seasonal allergies    Vertebral compression fracture (HCC)    Vitamin B12 deficiency    Vitamin D deficiency      Allergies No Known Allergies   Medications  Current Outpatient Medications:    acetaminophen (TYLENOL) 500 MG tablet, Take 2 tablets (1,000 mg total) by mouth every 6 (six) hours as needed for headache, fever or mild pain., Disp: , Rfl: 0   amLODipine (NORVASC) 5 MG tablet, Take 1 tablet (5 mg total) by mouth daily., Disp: 90 tablet, Rfl: 3   apixaban (ELIQUIS) 2.5 MG TABS tablet, Take 1 tablet (2.5 mg total) by mouth 2 (two) times daily., Disp: 60 tablet, Rfl: 0   bisoprolol (ZEBETA) 5 MG tablet, TAKE 1 TABLET BY MOUTH DAILY, Disp: 100 tablet, Rfl: 2   Cholecalciferol (VITAMIN D) 50 MCG (2000 UT) CAPS, Take 2,000 Units by mouth daily., Disp: , Rfl:    diclofenac (VOLTAREN) 75 MG EC tablet, Take 1 tablet (75 mg total) by mouth 2 (two) times daily as needed. (Patient taking differently: Take  75 mg by mouth as needed for mild pain or moderate pain.), Disp: 60 tablet, Rfl: 5   feeding supplement (ENSURE ENLIVE / ENSURE PLUS) LIQD, Take 237 mLs by mouth 2 (two) times daily between meals., Disp: 237 mL, Rfl: 12   fluticasone (FLONASE) 50 MCG/ACT nasal spray, Place into both nostrils. (Patient not taking: Reported on 06/02/2023), Disp: , Rfl:    gabapentin (NEURONTIN) 600 MG tablet, Take 1 tablet (600 mg total) by mouth 2 (two) times daily. (Patient taking differently: Take 600 mg by mouth at bedtime.), Disp: 180 tablet, Rfl: 1   hydrALAZINE (APRESOLINE) 25 MG tablet, Take 1 tablet (25 mg total) by mouth in the morning and at bedtime., Disp: 180 tablet, Rfl: 3   lisinopril (ZESTRIL) 10 MG tablet, Take 1 tablet (10 mg total) by mouth daily., Disp: 90 tablet,  Rfl: 3   Multiple Vitamin (MULTIVITAMIN WITH MINERALS) TABS tablet, Take 1 tablet by mouth daily., Disp: 130 tablet, Rfl: 0   omeprazole (PRILOSEC) 20 MG capsule, TAKE 1 CAPSULE BY MOUTH DAILY, Disp: 100 capsule, Rfl: 2   oxyCODONE (OXY IR/ROXICODONE) 5 MG immediate release tablet, Take 1 tablet (5 mg total) by mouth every 4 (four) hours as needed for moderate pain., Disp: 30 tablet, Rfl: 0   polyethylene glycol powder (GLYCOLAX/MIRALAX) 17 GM/SCOOP powder, Take 17 g by mouth daily as needed for mild constipation., Disp: 238 g, Rfl: 0   senna-docusate (SENOKOT-S) 8.6-50 MG tablet, Take 1 tablet by mouth at bedtime as needed for mild constipation., Disp: 30 tablet, Rfl: 0   Review of Systems Review of Systems  Constitutional:  Negative for chills and fever.  Respiratory:  Negative for shortness of breath.   Cardiovascular:  Positive for leg swelling. Negative for chest pain and palpitations.  Gastrointestinal:  Positive for constipation. Negative for abdominal pain, diarrhea, nausea and vomiting.  Neurological:  Negative for dizziness, weakness and headaches.       Objective:    Vitals BP (!) 146/62 (BP Location: Left Arm, Patient Position: Sitting)   Pulse 60   Temp (!) 97.3 F (36.3 C)   Resp 18   Ht 5\' 9"  (1.753 m)   Wt 188 lb 4 oz (85.4 kg)   SpO2 95%   BMI 27.80 kg/m    Physical Examination Physical Exam Constitutional:      Appearance: Normal appearance. He is not ill-appearing.  Cardiovascular:     Rate and Rhythm: Normal rate and regular rhythm.     Pulses: Normal pulses.     Heart sounds: No murmur heard.    No friction rub. No gallop.  Pulmonary:     Effort: Pulmonary effort is normal. No respiratory distress.     Breath sounds: No wheezing, rhonchi or rales.  Abdominal:     General: Abdomen is flat. Bowel sounds are normal. There is no distension.     Palpations: Abdomen is soft.     Tenderness: There is no abdominal tenderness.  Musculoskeletal:     Right  lower leg: Edema present.     Left lower leg: Edema present.     Comments: He has 2+ swelling of his left leg from his knee down to his ankle.  He has no pain on exam.  He has 1+ edema to his right lower leg.  Skin:    General: Skin is warm and dry.     Findings: No rash.  Neurological:     Mental Status: He is alert.  Assessment & Plan:   Leg swelling He has swelling of his left lower leg and ran out of his DVT prophylaxis last week.  He is s/p surgery.  We will obtain a venous doppler US to evaluate for DVT.    No follow-ups on file.   Crist Fat, MD

## 2023-07-11 NOTE — Assessment & Plan Note (Signed)
He has swelling of his left lower leg and ran out of his DVT prophylaxis last week.  He is s/p surgery.  We will obtain a venous doppler US to evaluate for DVT.

## 2023-07-12 ENCOUNTER — Other Ambulatory Visit: Payer: Self-pay | Admitting: Internal Medicine

## 2023-07-12 DIAGNOSIS — I951 Orthostatic hypotension: Secondary | ICD-10-CM | POA: Diagnosis not present

## 2023-07-12 DIAGNOSIS — M1990 Unspecified osteoarthritis, unspecified site: Secondary | ICD-10-CM | POA: Diagnosis not present

## 2023-07-12 DIAGNOSIS — I129 Hypertensive chronic kidney disease with stage 1 through stage 4 chronic kidney disease, or unspecified chronic kidney disease: Secondary | ICD-10-CM | POA: Diagnosis not present

## 2023-07-12 DIAGNOSIS — M80052D Age-related osteoporosis with current pathological fracture, left femur, subsequent encounter for fracture with routine healing: Secondary | ICD-10-CM | POA: Diagnosis not present

## 2023-07-12 DIAGNOSIS — G629 Polyneuropathy, unspecified: Secondary | ICD-10-CM | POA: Diagnosis not present

## 2023-07-12 DIAGNOSIS — M5410 Radiculopathy, site unspecified: Secondary | ICD-10-CM | POA: Diagnosis not present

## 2023-07-12 DIAGNOSIS — M25572 Pain in left ankle and joints of left foot: Secondary | ICD-10-CM | POA: Diagnosis not present

## 2023-07-12 DIAGNOSIS — G4700 Insomnia, unspecified: Secondary | ICD-10-CM | POA: Diagnosis not present

## 2023-07-12 DIAGNOSIS — N1832 Chronic kidney disease, stage 3b: Secondary | ICD-10-CM | POA: Diagnosis not present

## 2023-07-12 MED ORDER — FUROSEMIDE 20 MG PO TABS: 20.0000 mg | ORAL_TABLET | Freq: Every day | ORAL | 0 refills | Status: DC

## 2023-07-12 NOTE — Progress Notes (Signed)
I called Mr. Ocallaghan's son and informed him the [place] was negative of his legs.  I asked him to use ace wraps from feet to his knees and we will give him a 3-5 day course of lasix.

## 2023-07-15 DIAGNOSIS — H9193 Unspecified hearing loss, bilateral: Secondary | ICD-10-CM | POA: Diagnosis not present

## 2023-07-15 DIAGNOSIS — H6993 Unspecified Eustachian tube disorder, bilateral: Secondary | ICD-10-CM | POA: Diagnosis not present

## 2023-07-16 DIAGNOSIS — M80052D Age-related osteoporosis with current pathological fracture, left femur, subsequent encounter for fracture with routine healing: Secondary | ICD-10-CM | POA: Diagnosis not present

## 2023-07-16 DIAGNOSIS — G4700 Insomnia, unspecified: Secondary | ICD-10-CM | POA: Diagnosis not present

## 2023-07-16 DIAGNOSIS — M1990 Unspecified osteoarthritis, unspecified site: Secondary | ICD-10-CM | POA: Diagnosis not present

## 2023-07-16 DIAGNOSIS — M25572 Pain in left ankle and joints of left foot: Secondary | ICD-10-CM | POA: Diagnosis not present

## 2023-07-16 DIAGNOSIS — M5410 Radiculopathy, site unspecified: Secondary | ICD-10-CM | POA: Diagnosis not present

## 2023-07-16 DIAGNOSIS — I129 Hypertensive chronic kidney disease with stage 1 through stage 4 chronic kidney disease, or unspecified chronic kidney disease: Secondary | ICD-10-CM | POA: Diagnosis not present

## 2023-07-16 DIAGNOSIS — I951 Orthostatic hypotension: Secondary | ICD-10-CM | POA: Diagnosis not present

## 2023-07-16 DIAGNOSIS — G629 Polyneuropathy, unspecified: Secondary | ICD-10-CM | POA: Diagnosis not present

## 2023-07-16 DIAGNOSIS — N1832 Chronic kidney disease, stage 3b: Secondary | ICD-10-CM | POA: Diagnosis not present

## 2023-07-17 ENCOUNTER — Encounter: Payer: Self-pay | Admitting: Internal Medicine

## 2023-07-17 DIAGNOSIS — M810 Age-related osteoporosis without current pathological fracture: Secondary | ICD-10-CM | POA: Diagnosis not present

## 2023-07-17 DIAGNOSIS — M85851 Other specified disorders of bone density and structure, right thigh: Secondary | ICD-10-CM | POA: Diagnosis not present

## 2023-07-18 DIAGNOSIS — N1832 Chronic kidney disease, stage 3b: Secondary | ICD-10-CM | POA: Diagnosis not present

## 2023-07-18 DIAGNOSIS — I129 Hypertensive chronic kidney disease with stage 1 through stage 4 chronic kidney disease, or unspecified chronic kidney disease: Secondary | ICD-10-CM | POA: Diagnosis not present

## 2023-07-18 DIAGNOSIS — M5410 Radiculopathy, site unspecified: Secondary | ICD-10-CM | POA: Diagnosis not present

## 2023-07-18 DIAGNOSIS — M25572 Pain in left ankle and joints of left foot: Secondary | ICD-10-CM | POA: Diagnosis not present

## 2023-07-18 DIAGNOSIS — I951 Orthostatic hypotension: Secondary | ICD-10-CM | POA: Diagnosis not present

## 2023-07-18 DIAGNOSIS — M80052D Age-related osteoporosis with current pathological fracture, left femur, subsequent encounter for fracture with routine healing: Secondary | ICD-10-CM | POA: Diagnosis not present

## 2023-07-18 DIAGNOSIS — G4700 Insomnia, unspecified: Secondary | ICD-10-CM | POA: Diagnosis not present

## 2023-07-18 DIAGNOSIS — G629 Polyneuropathy, unspecified: Secondary | ICD-10-CM | POA: Diagnosis not present

## 2023-07-18 DIAGNOSIS — M1990 Unspecified osteoarthritis, unspecified site: Secondary | ICD-10-CM | POA: Diagnosis not present

## 2023-07-23 DIAGNOSIS — S72142D Displaced intertrochanteric fracture of left femur, subsequent encounter for closed fracture with routine healing: Secondary | ICD-10-CM | POA: Diagnosis not present

## 2023-07-26 ENCOUNTER — Encounter: Payer: Self-pay | Admitting: Internal Medicine

## 2023-07-26 ENCOUNTER — Ambulatory Visit: Payer: Medicare HMO | Admitting: Internal Medicine

## 2023-07-26 VITALS — BP 170/70 | HR 68 | Temp 100.0°F | Resp 16 | Ht 69.0 in | Wt 178.4 lb

## 2023-07-26 DIAGNOSIS — G894 Chronic pain syndrome: Secondary | ICD-10-CM

## 2023-07-26 DIAGNOSIS — K5903 Drug induced constipation: Secondary | ICD-10-CM

## 2023-07-26 DIAGNOSIS — T402X5A Adverse effect of other opioids, initial encounter: Secondary | ICD-10-CM | POA: Diagnosis not present

## 2023-07-26 MED ORDER — OXYCODONE HCL 5 MG PO TABS: 2.5000 mg | ORAL_TABLET | Freq: Two times a day (BID) | ORAL | 0 refills | Status: DC | PRN

## 2023-07-26 NOTE — Progress Notes (Addendum)
Office Visit  Subjective   Patient ID: Andrew Montgomery   DOB: 03-Oct-1932   Age: 89 y.o.   MRN: [id number]   Chief Complaint Chief Complaint  Patient presents with   Acute Visit    Feeling constipated     History of Present Illness [name] is an 89 y.o. male who returns today as he is having acute problems with urinary and fecal retention due to his hydrocodone/APAP use.   Again, he was hospitalized from 06/02/2023 until 06/07/2023 where he had a left femur fracture.  He sustained a left intertrochanteric femur fracture and underwent cephalomedullary nail fixation done on 06/03/2023.  He has seen the orthopedist in multiple followups where he was initially sent home on oxycodone 5mg  po q 4 hrs prn.  He ran out of this and is mostly have pain that keeps him up at night and the orthopedist wrote him for hydrocodone/APAP 5/325 po q 6 hr prn on 06/25/2023.  Since then, he has had constipation and urinary retention.  The patient has a history of choronic pain with history of radiculopathy of his lower extremities.  He was controlling his pain up until he had this femur fracture where the only medicine he took for his pain was gabapentin 600mg  at night time which would control his pain.  He did not take his hydrocodone/APAP last night but he was unable to sleep due to the pain.   His last BM was 4-5 days ago.    The patient is a 89 year old Caucasian/White male who presents for a follow-up evaluation of hypertension.  This past year, his BP was borderline where I switched his bisoprolol-HCT 5-6.25mg  to plain bisoprolol 5mg  daily and chlorthalidone 25mg  daily. The patient has been checking his blood pressure at home where his his systlic blood pressure is running 130-140's.   The patient's current medications include: amlodipine 10 mg daily, lisinopril 40mg  daily, hydralazine 25mg  BID, bisoprolol 5mg  daily, and chlorthalidone 25mg  daily.  The patient has been tolerating his medications well. The patient denies  any headache, visual changes, dizziness, lightheadness, chest pain, shortness of breath, weakness/numbness, and edema. He reports there have been no other symptoms noted.       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)    Osteoarthritis    Osteoporosis    PSA, INCREASED 01/05/2010   Radiculopathy with lower extremity symptoms    Seasonal allergies    Vertebral compression fracture (HCC)    Vitamin B12 deficiency    Vitamin D deficiency      Allergies No Known Allergies   Medications  Current Outpatient Medications:    oxyCODONE (OXY IR/ROXICODONE) 5 MG immediate release tablet, Take 0.5 tablets (2.5 mg total) by mouth every 12 (twelve) hours as needed for severe pain., Disp: 30 tablet, Rfl: 0   acetaminophen (TYLENOL) 500 MG tablet, Take 2 tablets (1,000 mg total) by mouth every 6 (six) hours as needed for headache, fever or mild pain., Disp: , Rfl: 0   amLODipine (NORVASC) 5 MG tablet, Take 1 tablet (5 mg total) by mouth daily., Disp: 90 tablet, Rfl: 3   apixaban (ELIQUIS) 2.5 MG TABS tablet, Take 1 tablet (2.5  mg total) by mouth 2 (two) times daily., Disp: 60 tablet, Rfl: 0   bisoprolol (ZEBETA) 5 MG tablet, TAKE 1 TABLET BY MOUTH DAILY, Disp: 100 tablet, Rfl: 2   Cholecalciferol (VITAMIN D) 50 MCG (2000 UT) CAPS, Take 2,000 Units by mouth daily., Disp: , Rfl:    diclofenac (VOLTAREN) 75 MG EC tablet, Take 1 tablet (75 mg total) by mouth 2 (two) times daily as needed. (Patient taking differently: Take 75 mg by mouth as needed for mild pain or moderate pain.), Disp: 60 tablet, Rfl: 5   feeding supplement (ENSURE ENLIVE / ENSURE PLUS) LIQD, Take 237 mLs by mouth 2 (two) times daily between  meals., Disp: 237 mL, Rfl: 12   fluticasone (FLONASE) 50 MCG/ACT nasal spray, Place into both nostrils. (Patient not taking: Reported on 06/02/2023), Disp: , Rfl:    furosemide (LASIX) 20 MG tablet, Take 1 tablet (20 mg total) by mouth daily for 5 days., Disp: 5 tablet, Rfl: 0   gabapentin (NEURONTIN) 600 MG tablet, Take 1 tablet (600 mg total) by mouth 2 (two) times daily. (Patient taking differently: Take 600 mg by mouth at bedtime.), Disp: 180 tablet, Rfl: 1   hydrALAZINE (APRESOLINE) 25 MG tablet, Take 1 tablet (25 mg total) by mouth in the morning and at bedtime., Disp: 180 tablet, Rfl: 3   lisinopril (ZESTRIL) 10 MG tablet, Take 1 tablet (10 mg total) by mouth daily., Disp: 90 tablet, Rfl: 3   Multiple Vitamin (MULTIVITAMIN WITH MINERALS) TABS tablet, Take 1 tablet by mouth daily., Disp: 130 tablet, Rfl: 0   omeprazole (PRILOSEC) 20 MG capsule, TAKE 1 CAPSULE BY MOUTH DAILY, Disp: 100 capsule, Rfl: 2   oxyCODONE (OXY IR/ROXICODONE) 5 MG immediate release tablet, Take 1 tablet (5 mg total) by mouth every 4 (four) hours as needed for moderate pain., Disp: 30 tablet, Rfl: 0   polyethylene glycol powder (GLYCOLAX/MIRALAX) 17 GM/SCOOP powder, Take 17 g by mouth daily as needed for mild constipation., Disp: 238 g, Rfl: 0   senna-docusate (SENOKOT-S) 8.6-50 MG tablet, Take 1 tablet by mouth at bedtime as needed for mild constipation., Disp: 30 tablet, Rfl: 0   Review of Systems Review of Systems  Constitutional:  Negative for chills and fever.  Eyes:  Negative for blurred vision and double vision.  Cardiovascular:  Negative for chest pain, palpitations and leg swelling.  Gastrointestinal:  Positive for constipation. Negative for abdominal pain, nausea and vomiting.  Musculoskeletal:  Positive for joint pain.  Neurological:  Negative for dizziness, weakness and headaches.       Objective:    Vitals BP (!) 170/70   Pulse 68   Temp 100 F (37.8 C)   Resp 16   Ht 5\' 9"  (1.753 m)   Wt 178 lb  6.4 oz (80.9 kg)   SpO2 96%   BMI 26.35 kg/m    Physical Examination Physical Exam Constitutional:      Appearance: Normal appearance. He is not ill-appearing.  Cardiovascular:     Rate and Rhythm: Normal rate and regular rhythm.     Pulses: Normal pulses.     Heart sounds: No murmur heard.    No friction rub. No gallop.  Pulmonary:     Effort: Pulmonary effort is normal. No respiratory distress.     Breath sounds: No wheezing, rhonchi or rales.  Abdominal:     General: Abdomen is flat. Bowel sounds are normal. There is no distension.     Palpations: Abdomen is soft.  Tenderness: There is no abdominal tenderness.  Musculoskeletal:     Right lower leg: No edema.     Left lower leg: No edema.  Skin:    General: Skin is warm and dry.     Findings: No rash.  Neurological:     Mental Status: He is alert.        Assessment & Plan:   Chronic pain syndrome I want him to stop the hydrocodone/APAP.  He is to take miralax at this time which he states has helped in the past when he took his pain meds.  WE will put him on oxycodone 2.5mg  po q 12 hrs prn for his pain.  I reviewed his [place] controlled [place] registry.    No follow-ups on file.   Crist Fat, MD

## 2023-07-26 NOTE — Assessment & Plan Note (Signed)
I want him to stop the hydrocodone/APAP.  He is to take miralax at this time which he states has helped in the past when he took his pain meds.  WE will put him on oxycodone 2.5mg  po q 12 hrs prn for his pain.  I reviewed his [place] controlled subtance registry.

## 2023-07-29 ENCOUNTER — Ambulatory Visit: Payer: Medicare HMO

## 2023-07-29 DIAGNOSIS — E538 Deficiency of other specified B group vitamins: Secondary | ICD-10-CM

## 2023-07-29 MED ORDER — CYANOCOBALAMIN 1000 MCG/ML IJ SOLN
1000.0000 ug | Freq: Once | INTRAMUSCULAR | Status: AC
Start: 2023-07-29 — End: 2023-07-29
  Administered 2023-07-29: 1000 ug via INTRAMUSCULAR

## 2023-07-31 ENCOUNTER — Telehealth: Payer: Self-pay

## 2023-07-31 NOTE — Telephone Encounter (Signed)
Pt called regarding msg left to contact office for DEXA scan results. Pt was notified of no changes to osteoporosis and was advised that VE recommends IV reclast. Pt agreed to this treatment and will come by office for CMP.

## 2023-08-15 DIAGNOSIS — H353132 Nonexudative age-related macular degeneration, bilateral, intermediate dry stage: Secondary | ICD-10-CM | POA: Diagnosis not present

## 2023-08-30 ENCOUNTER — Ambulatory Visit: Payer: Medicare HMO

## 2023-08-30 DIAGNOSIS — E538 Deficiency of other specified B group vitamins: Secondary | ICD-10-CM | POA: Diagnosis not present

## 2023-08-30 MED ORDER — CYANOCOBALAMIN 1000 MCG/ML IJ SOLN
1000.0000 ug | Freq: Once | INTRAMUSCULAR | Status: AC
Start: 2023-08-30 — End: 2023-08-30
  Administered 2023-08-30: 1000 ug via INTRAMUSCULAR

## 2023-08-30 NOTE — Progress Notes (Signed)
Nurse visit only for B12 injection

## 2023-09-03 DIAGNOSIS — S72142D Displaced intertrochanteric fracture of left femur, subsequent encounter for closed fracture with routine healing: Secondary | ICD-10-CM | POA: Diagnosis not present

## 2023-09-05 ENCOUNTER — Other Ambulatory Visit: Payer: Self-pay | Admitting: Internal Medicine

## 2023-09-13 DIAGNOSIS — H524 Presbyopia: Secondary | ICD-10-CM | POA: Diagnosis not present

## 2023-09-24 ENCOUNTER — Encounter: Payer: Self-pay | Admitting: Internal Medicine

## 2023-09-24 ENCOUNTER — Ambulatory Visit: Payer: Medicare HMO | Admitting: Internal Medicine

## 2023-09-24 VITALS — BP 122/66 | HR 68 | Temp 97.6°F | Resp 16 | Ht 69.0 in | Wt 176.2 lb

## 2023-09-24 DIAGNOSIS — G894 Chronic pain syndrome: Secondary | ICD-10-CM | POA: Diagnosis not present

## 2023-09-24 DIAGNOSIS — M8000XD Age-related osteoporosis with current pathological fracture, unspecified site, subsequent encounter for fracture with routine healing: Secondary | ICD-10-CM | POA: Diagnosis not present

## 2023-09-24 DIAGNOSIS — D649 Anemia, unspecified: Secondary | ICD-10-CM | POA: Diagnosis not present

## 2023-09-24 DIAGNOSIS — Z23 Encounter for immunization: Secondary | ICD-10-CM

## 2023-09-24 MED ORDER — CELECOXIB 200 MG PO CAPS: 200.0000 mg | ORAL_CAPSULE | Freq: Two times a day (BID) | ORAL | 3 refills | Status: DC

## 2023-09-24 NOTE — Progress Notes (Unsigned)
Office Visit  Subjective   Patient ID: Andrew Montgomery   DOB: Dec 05, 1932   Age: 89 y.o.   MRN: [id number]   Chief Complaint Chief Complaint  Patient presents with   Follow-up     History of Present Illness I did see the patient in 07/2023 for acute problems with urinary and fecal retention due to his hydrocodone/APAP use.   Again, he was hospitalized from 06/02/2023 until 06/07/2023 where he had a left femur fracture.  He sustained a left intertrochanteric femur fracture and underwent cephalomedullary nail fixation done on 06/03/2023.  He has seen the orthopedist in multiple followups where he was initially sent home on oxycodone 5mg  po q 4 hrs prn.  He ran out of this and is mostly have pain that keeps him up at night and the orthopedist wrote him for hydrocodone/APAP 5/325 po q 6 hr prn on 06/25/2023.  The patient began having constipation and urinary retention after starting on hydrocodone/APAP.  The patient has a history of choronic pain with history of radiculopathy of his lower extremities.  He was controlling his pain up until he had this femur fracture where the only medicine he took for his pain was gabapentin 600mg  at night time which would control his pain.  I stopped him off the hydrocodone/APAP and miralax.  We changed him to oxycodone 2.5mg  po q 12 hrs prn for his pain.  Today, he states he finished that med and has not gone back on it.  He stopped taking this med in Sept.  We also noticed post-operatively that he had some anemia.  His HgB was 12.3 in 02/2023.  He went down postoperatively to a HgB of 7.1 where he was transfused.  I am going to repeat his CBC today and obtain iron studies and I gave him a FIT test to check for occult blood in his stools.  This was checked previously in 2022 and was negative. His FIT test in 06/2023 was negative and his iron studies looked good.    Andrew Montgomery returns for followup of his chronic pain.  He has a history of radiculopathy of his lower extremities.  He  states that since his surgery that he now has pain over his left femur/thigh area.  This is a sharp pain and he states his arthritis is also severe in his left knee.  He states ortho saw him in followup and saw his femur was healing well.  The patient also has arthritis which involves all his joints including his back, legs, hips, and shoulders. He is currently on diclofenac tabs as needed and gabapentin.  He states if he gets pain with his joints, he will use diclofenac for 3-4 days then stop.  He has not had to use Tylenol prn for a "good while".   He also has cervical foraminal stenosis with a history of neck pain but his neck pain resolved 5 years ago.   He had a MRI of his cervical spine performed on 06/02/2018 and this demonstrated foraminal stenosis of C4-C5, C5-C6, and C6-C7 with edematous facet arthropathy of his left C4-C5.  There is no new weakness/numbness or loss of bowel/bladder function.  His pain was a bit worse in his legs on his visit in 01/2020 and I increased his gabapentin from 300mg  in AM and 600mg  in PM to 600mg  BID.  He is now only only taking gabapentin 600mg  at night time where it takes care of most of his pain.    The  patient does have a history of osteoporosis and a vertebral compression fracture.  He had a bone density performed on 12/2016 which showed a t-score of -2.1.  However, he has had a vertebral compression fracture which indicates he has osteoporosis.  He was on fosamax but this was stopped in 2023.  His Vit D level was normal on testing.   I did refer him to the neurosurgeon for evaluation for kyphoplasty.  Their notes noted that he has a healed compression fracture but they felt he was having neuropathy in his hands and feet and sent him to neurology for evaluation.  I saw him several years ago where he was having lower extremity pain and numbness which initially began in his left leg which had progressively worsened and involved both bilateral lower extremities.  I did a lab  work up and sent him for EMG testing and this showed no evidence of a peripheral neuropathy.  He did have a chronic left L4 radiculopathy seen on EMG testing.  I did start him on gabapentin at that time.  We also obtained a lumbar xray and this demonstrated a partial compression of the body of L3.  He states he had a fall in 2011 and where he broke his ankle and hurt his arm.  He states he is not sure if this caused any problems with his back but he had no back pain at that time.  I wanted to set him up with [facility] for evaluation for kyphoplasty but the patient refused to go because he states no one explained to him what was going on.  I reviewed our chart and the nurse informed him what was going on and he had stated in her note that he did not want surgery and declined to go.  However he finally did go see neurology with Dr. Allena Katz.  I do not have her notes but the neurosurgeon felt he has a peripheral neuropathy.  On lab testing for his lower extremity numbness, we discovered that he had a Vit B12 defiency.  He does get VIt B12 injections done monthly.  The patient was sent for physical therapy for his neuropathy for a few months and the patient quit going because he felt it was not helping.   The patient is a 89 year old Caucasian/White male who presents for followup of his osteoporosis. The patient does have a history of osteoporosis and a vertebral compression fracture.   Again, he was hospitalized from 06/02/2023 until 06/07/2023 where he had a left femur fracture.  He sustained a left intertrochanteric femur fracture and underwent cephalomedullary nail fixation done on 06/03/2023.He had a bone density performed on 12/2016 which showed a t-score of -2.1. However, he has had a vertebral compression fracture which indicates he has osteoporosis. We started him on fosamax which he is taking once a week. His Vit D level was normal on testing.Marland Kitchen He has a history of spinal fractures. He denies a  family history of osteoporosis. Current risk factors: alcohol consumption of more that 7 ounces per week. He denies the following: smoking, diabetes mellitus, high caffeine intake, daily prednisone use, hyperthyroidism, bowel resection, and gastric resection. He states he does not exercise routinely. A bone mineral density study was performed as described above in 12/2016 with a t-score of -2.1. We did repeat a bone density in 02/22/2021 and this showed a t-score of -2.3. I had a discussion with him and we stopped his Fosamax in 01/2022.  He  did not want to do another bone density when I saw him in 02/2023 for an annual exam however we did finally get him to repeat a bone density on 07/17/2023 whic showed a femur neck t-score of -2.1 showing osteopenia.   We did check his Vit D levels at that time and they were normal with 53.1 value.       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)    Osteoarthritis    Osteoporosis    PSA, INCREASED 01/05/2010   Radiculopathy with lower extremity symptoms    Seasonal allergies    Vertebral compression fracture (HCC)    Vitamin B12 deficiency    Vitamin D deficiency      Allergies No Known Allergies   Medications  Current Outpatient Medications:    acetaminophen (TYLENOL) 500 MG tablet, Take 2 tablets (1,000 mg total) by mouth every 6 (six) hours as needed for headache, fever or mild pain., Disp: , Rfl: 0   amLODipine (NORVASC) 5 MG tablet, Take 1 tablet (5 mg total) by mouth daily., Disp: 90 tablet, Rfl: 3   apixaban (ELIQUIS) 2.5 MG TABS tablet, Take 1 tablet (2.5 mg total) by mouth 2 (two) times daily., Disp: 60 tablet, Rfl: 0   bisoprolol (ZEBETA) 5 MG tablet,  TAKE 1 TABLET EVERY DAY, Disp: 90 tablet, Rfl: 3   chlorthalidone (HYGROTON) 25 MG tablet, TAKE 1 TABLET EVERY DAY, Disp: 90 tablet, Rfl: 3   Cholecalciferol (VITAMIN D) 50 MCG (2000 UT) CAPS, Take 2,000 Units by mouth daily., Disp: , Rfl:    feeding supplement (ENSURE ENLIVE / ENSURE PLUS) LIQD, Take 237 mLs by mouth 2 (two) times daily between meals., Disp: 237 mL, Rfl: 12   fluticasone (FLONASE) 50 MCG/ACT nasal spray, Place into both nostrils. (Patient not taking: Reported on 06/02/2023), Disp: , Rfl:    furosemide (LASIX) 20 MG tablet, Take 1 tablet (20 mg total) by mouth daily for 5 days., Disp: 5 tablet, Rfl: 0   gabapentin (NEURONTIN) 600 MG tablet, Take 1 tablet (600 mg total) by mouth 2 (two) times daily. (Patient taking differently: Take 600 mg by mouth at bedtime.), Disp: 180 tablet, Rfl: 1   hydrALAZINE (APRESOLINE) 25 MG tablet, Take 1 tablet (25 mg total) by mouth in the morning and at bedtime., Disp: 180 tablet, Rfl: 3   lisinopril (ZESTRIL) 10 MG tablet, Take 1 tablet (10 mg total) by mouth daily., Disp: 90 tablet, Rfl: 3   Multiple Vitamin (MULTIVITAMIN WITH MINERALS) TABS tablet, Take 1 tablet by mouth daily., Disp: 130 tablet, Rfl: 0   omeprazole (PRILOSEC) 20 MG capsule, TAKE 1 CAPSULE EVERY DAY, Disp: 90 capsule, Rfl: 3   polyethylene glycol powder (GLYCOLAX/MIRALAX) 17 GM/SCOOP powder, Take 17 g by mouth daily as needed for mild constipation., Disp: 238 g, Rfl: 0   senna-docusate (SENOKOT-S) 8.6-50 MG tablet, Take 1 tablet by mouth at bedtime as needed for mild constipation., Disp: 30 tablet, Rfl: 0   Review of Systems Review of Systems  Constitutional:  Negative for chills and fever.  Eyes:  Negative for blurred vision and double vision.  Respiratory:  Negative for cough and shortness of breath.   Cardiovascular:  Negative  for chest pain, palpitations and leg swelling.  Gastrointestinal:  Negative for abdominal pain, blood in stool, constipation, diarrhea, heartburn, melena,  nausea and vomiting.  Genitourinary:  Negative for frequency.  Musculoskeletal:  Positive for back pain and joint pain. Negative for myalgias.  Skin:  Negative for itching and rash.  Neurological:  Negative for dizziness, weakness and headaches.  Endo/Heme/Allergies:  Negative for polydipsia.       Objective:    Vitals BP 122/66   Pulse 68   Temp 97.6 F (36.4 C)   Resp 16   Ht 5\' 9"  (1.753 m)   Wt 176 lb 3.2 oz (79.9 kg)   SpO2 97%   BMI 26.02 kg/m    Physical Examination Physical Exam Constitutional:      Appearance: Normal appearance. He is not ill-appearing.  Cardiovascular:     Rate and Rhythm: Normal rate and regular rhythm.     Pulses: Normal pulses.     Heart sounds: No murmur heard.    No friction rub. No gallop.  Pulmonary:     Effort: Pulmonary effort is normal. No respiratory distress.     Breath sounds: No wheezing, rhonchi or rales.  Abdominal:     General: Abdomen is flat. Bowel sounds are normal. There is no distension.     Palpations: Abdomen is soft.     Tenderness: There is no abdominal tenderness.  Musculoskeletal:     Right lower leg: No edema.     Left lower leg: No edema.  Skin:    General: Skin is warm and dry.     Findings: No rash.  Neurological:     General: No focal deficit present.     Mental Status: He is alert and oriented to person, place, and time.  Psychiatric:        Mood and Affect: Mood normal.        Behavior: Behavior normal.        Assessment & Plan:   Anemia His HgB was 12.3 in 02/2023.  He went down postoperatively to a HgB of 7.1 where he was transfused.  His stools were negative for blood when I worked this up in 06/2023.  His HgB levels were improving and his iron studies looked good.  We will repeat his CBC today.  Chronic pain syndrome He is currently on gabapentin 600mg  at bedtime and on diclofenac 75mg  BID.  He is not using the diclofenac as needed but taking it daily.  He does not want to be on oxycodone or  hydrocodone.  He takes tylenol prn for pain as well.  I had a discussion with him about taking him off diclofenac and putting him on celebrex.  Age-related osteoporosis with current pathological fracture He has a history of vertebral compression fractures and then his left femur fracture from this past year.  His bone density was reviewed and he does have osteoporosis.  His Vit D level is normal.  I am going to set him up for iv reclast at this time.    Return in about 3 months (around 12/25/2023).   Crist Fat, MD

## 2023-09-24 NOTE — Assessment & Plan Note (Signed)
His HgB was 12.3 in 02/2023.  He went down postoperatively to a HgB of 7.1 where he was transfused.  His stools were negative for blood when I worked this up in 06/2023.  His HgB levels were improving and his iron studies looked good.  We will repeat his CBC today.

## 2023-09-24 NOTE — Assessment & Plan Note (Signed)
He has a history of vertebral compression fractures and then his left femur fracture from this past year.  His bone density was reviewed and he does have osteoporosis.  His Vit D level is normal.  I am going to set him up for iv reclast at this time.

## 2023-09-24 NOTE — Assessment & Plan Note (Signed)
He is currently on gabapentin 600mg  at bedtime and on diclofenac 75mg  BID.  He is not using the diclofenac as needed but taking it daily.  He does not want to be on oxycodone or hydrocodone.  He takes tylenol prn for pain as well.  I had a discussion with him about taking him off diclofenac and putting him on celebrex.

## 2023-09-25 LAB — CBC WITH DIFFERENTIAL/PLATELET
Basophils Absolute: 0.1 10*3/uL (ref 0.0–0.2)
Basos: 1 %
EOS (ABSOLUTE): 0.1 10*3/uL (ref 0.0–0.4)
Eos: 1 %
Hematocrit: 31.4 % — ABNORMAL LOW (ref 37.5–51.0)
Hemoglobin: 10 g/dL — ABNORMAL LOW (ref 13.0–17.7)
Immature Grans (Abs): 0 10*3/uL (ref 0.0–0.1)
Immature Granulocytes: 0 %
Lymphocytes Absolute: 2 10*3/uL (ref 0.7–3.1)
Lymphs: 31 %
MCH: 30 pg (ref 26.6–33.0)
MCHC: 31.8 g/dL (ref 31.5–35.7)
MCV: 94 fL (ref 79–97)
Monocytes Absolute: 0.5 10*3/uL (ref 0.1–0.9)
Monocytes: 8 %
Neutrophils Absolute: 3.7 10*3/uL (ref 1.4–7.0)
Neutrophils: 59 %
Platelets: 296 10*3/uL (ref 150–450)
RBC: 3.33 x10E6/uL — ABNORMAL LOW (ref 4.14–5.80)
RDW: 14.2 % (ref 11.6–15.4)
WBC: 6.4 10*3/uL (ref 3.4–10.8)

## 2023-09-25 LAB — CMP14 + ANION GAP
ALT: 13 [IU]/L (ref 0–44)
AST: 19 [IU]/L (ref 0–40)
Albumin: 3.9 g/dL (ref 3.7–4.7)
Alkaline Phosphatase: 94 [IU]/L (ref 44–121)
Anion Gap: 11 mmol/L (ref 10.0–18.0)
BUN/Creatinine Ratio: 25 — ABNORMAL HIGH (ref 10–24)
BUN: 30 mg/dL — ABNORMAL HIGH (ref 8–27)
Bilirubin Total: 0.4 mg/dL (ref 0.0–1.2)
CO2: 24 mmol/L (ref 20–29)
Calcium: 9.2 mg/dL (ref 8.6–10.2)
Chloride: 101 mmol/L (ref 96–106)
Creatinine, Ser: 1.18 mg/dL (ref 0.76–1.27)
Globulin, Total: 2.2 g/dL (ref 1.5–4.5)
Glucose: 94 mg/dL (ref 70–99)
Potassium: 4.7 mmol/L (ref 3.5–5.2)
Sodium: 136 mmol/L (ref 134–144)
Total Protein: 6.1 g/dL (ref 6.0–8.5)
eGFR: 59 mL/min/{1.73_m2} — ABNORMAL LOW (ref >59)

## 2023-09-30 ENCOUNTER — Ambulatory Visit: Payer: Medicare HMO

## 2023-09-30 DIAGNOSIS — E538 Deficiency of other specified B group vitamins: Secondary | ICD-10-CM

## 2023-09-30 DIAGNOSIS — D649 Anemia, unspecified: Secondary | ICD-10-CM

## 2023-09-30 MED ORDER — CYANOCOBALAMIN 1000 MCG/ML IJ SOLN
1000.0000 ug | Freq: Once | INTRAMUSCULAR | Status: AC
Start: 2023-09-30 — End: 2023-09-30
  Administered 2023-09-30: 1000 ug via INTRAMUSCULAR

## 2023-09-30 NOTE — Progress Notes (Signed)
Nurse visit for B12 injection

## 2023-10-02 ENCOUNTER — Encounter: Payer: Self-pay | Admitting: Internal Medicine

## 2023-10-02 DIAGNOSIS — Z0100 Encounter for examination of eyes and vision without abnormal findings: Secondary | ICD-10-CM | POA: Diagnosis not present

## 2023-10-15 NOTE — Progress Notes (Signed)
His lab look good for his iv reclast.  Patient is aware of labs

## 2023-10-28 ENCOUNTER — Other Ambulatory Visit: Payer: Self-pay

## 2023-10-28 DIAGNOSIS — M810 Age-related osteoporosis without current pathological fracture: Secondary | ICD-10-CM | POA: Diagnosis not present

## 2023-10-28 MED ORDER — CELECOXIB 200 MG PO CAPS: 200.0000 mg | ORAL_CAPSULE | Freq: Two times a day (BID) | ORAL | 3 refills | Status: DC

## 2023-10-28 NOTE — Progress Notes (Signed)
Rx Refill/ [facility]

## 2023-11-01 ENCOUNTER — Ambulatory Visit: Payer: Medicare HMO

## 2023-11-04 ENCOUNTER — Ambulatory Visit: Payer: Medicare HMO | Admitting: Internal Medicine

## 2023-11-04 DIAGNOSIS — E538 Deficiency of other specified B group vitamins: Secondary | ICD-10-CM

## 2023-11-04 MED ORDER — CYANOCOBALAMIN 1000 MCG/ML IJ SOLN
1000.0000 ug | Freq: Once | INTRAMUSCULAR | Status: AC
Start: 2023-11-04 — End: 2024-06-17
  Administered 2024-06-17: 1000 ug via INTRAMUSCULAR

## 2023-11-04 NOTE — Progress Notes (Signed)
Nurse visit

## 2023-12-01 DIAGNOSIS — M25552 Pain in left hip: Secondary | ICD-10-CM | POA: Diagnosis not present

## 2023-12-01 DIAGNOSIS — M25551 Pain in right hip: Secondary | ICD-10-CM | POA: Diagnosis not present

## 2023-12-01 DIAGNOSIS — E871 Hypo-osmolality and hyponatremia: Secondary | ICD-10-CM | POA: Diagnosis not present

## 2023-12-01 DIAGNOSIS — Z79899 Other long term (current) drug therapy: Secondary | ICD-10-CM | POA: Diagnosis not present

## 2023-12-01 DIAGNOSIS — I10 Essential (primary) hypertension: Secondary | ICD-10-CM | POA: Diagnosis not present

## 2023-12-03 DIAGNOSIS — S72142D Displaced intertrochanteric fracture of left femur, subsequent encounter for closed fracture with routine healing: Secondary | ICD-10-CM | POA: Diagnosis not present

## 2023-12-05 ENCOUNTER — Ambulatory Visit: Payer: Medicare HMO | Admitting: Internal Medicine

## 2023-12-05 ENCOUNTER — Encounter: Payer: Self-pay | Admitting: Internal Medicine

## 2023-12-05 ENCOUNTER — Other Ambulatory Visit: Payer: Self-pay | Admitting: Internal Medicine

## 2023-12-05 ENCOUNTER — Ambulatory Visit: Payer: Medicare HMO

## 2023-12-05 VITALS — BP 148/82 | Temp 98.7°F | Resp 17 | Ht 69.0 in | Wt 181.6 lb

## 2023-12-05 DIAGNOSIS — M150 Primary generalized (osteo)arthritis: Secondary | ICD-10-CM | POA: Diagnosis not present

## 2023-12-05 MED ORDER — SENNOSIDES-DOCUSATE SODIUM 8.6-50 MG PO TABS: 1.0000 | ORAL_TABLET | Freq: Every evening | ORAL | 1 refills | Status: DC | PRN

## 2023-12-05 MED ORDER — TRAMADOL HCL 50 MG PO TABS: 50.0000 mg | ORAL_TABLET | Freq: Four times a day (QID) | ORAL | 0 refills | Status: DC | PRN

## 2023-12-05 NOTE — Progress Notes (Signed)
Office Visit  Subjective   Patient ID: Andrew Montgomery   DOB: 1932/01/03   Age: 89 y.o.   MRN: [id number]   Chief Complaint Chief Complaint  Patient presents with   Follow-up    Hospital F/U- Sunday     History of Present Illness Andrew Montgomery is a 89 yo male who comes in today for an ER followup from [facility]  He went to RN on 12/01/2023 with complaints of right hip pain.  He states he fell back and hit the counter but he did not have a fall on the floor.  This occurred about 4 weeks earlier.  He had no pain for about 2 weeks and was ambulatory during this time.  The patient however woke up last week with right lower back and right hip pain that was severe.  There is no loss of strength and no numbness of his legs/feet.  RH did a plain film of his hip that showed no acute fracture.  There was hardware present within the left femoral neck and proximal femoral diaphysis where he has had an ORIF in the past.  They then did a CT scan of his pelvis that showed degenerative changes within the lumbrosacral spine as well as within the bilateral hip and sacroillac joints.  There was no fracture.  He had atherosclerotic vascular disease.  He was sent out on a medrol dose pak and muscle relaxers where he is on the 3rd day of meds and he states it is not helping.  His pain is described as a sharp pain which is continuous but if he gets up and walks, his pain worsens.  He cannot lie on his back and he has not been sleeping.  He remains on celebrex and takes tylenol as needed but this has not helped.  I saw Andrew Montgomery several months ago where he was having acute problems with urinary and fecal retention due to his hydrocodone/APAP use.   Again, he was hospitalized from 06/02/2023 until 06/07/2023 where he had a left femur fracture.  He sustained a left intertrochanteric femur fracture and underwent cephalomedullary nail fixation done on 06/03/2023.  He has seen the orthopedist in multiple followups where he was initially sent  home on oxycodone 5mg  po q 4 hrs prn. The patient has a history of choronic pain with history of radiculopathy of his lower extremities.  He was controlling his pain up until he had this femur fracture where the only medicine he took for his pain was gabapentin 600mg  at night time which would control his pain.       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)    Osteoarthritis    Osteoporosis    PSA, INCREASED 01/05/2010   Radiculopathy with lower extremity symptoms    Seasonal allergies    Vertebral compression fracture (HCC)    Vitamin B12 deficiency    Vitamin D deficiency      Allergies No Known Allergies   Medications  Current Outpatient Medications:    methylPREDNISolone (MEDROL DOSEPAK) 4 MG TBPK tablet, Take 4 mg by mouth as directed., Disp: , Rfl:    acetaminophen (TYLENOL) 500 MG tablet, Take 2 tablets (1,000  mg total) by mouth every 6 (six) hours as needed for headache, fever or mild pain., Disp: , Rfl: 0   amLODipine (NORVASC) 5 MG tablet, Take 1 tablet (5 mg total) by mouth daily., Disp: 90 tablet, Rfl: 3   apixaban (ELIQUIS) 2.5 MG TABS tablet, Take 1 tablet (2.5 mg total) by mouth 2 (two) times daily., Disp: 60 tablet, Rfl: 0   bisoprolol (ZEBETA) 5 MG tablet, TAKE 1 TABLET EVERY DAY, Disp: 90 tablet, Rfl: 3   celecoxib (CELEBREX) 200 MG capsule, Take 1 capsule (200 mg total) by mouth 2 (two) times daily., Disp: 90 capsule, Rfl: 3   chlorthalidone (HYGROTON) 25 MG tablet, TAKE 1 TABLET EVERY DAY, Disp: 90 tablet, Rfl: 3   Cholecalciferol (VITAMIN D) 50 MCG (2000 UT) CAPS, Take 2,000 Units by mouth daily., Disp: , Rfl:    feeding supplement (ENSURE ENLIVE / ENSURE PLUS) LIQD,  Take 237 mLs by mouth 2 (two) times daily between meals., Disp: 237 mL, Rfl: 12   fluticasone (FLONASE) 50 MCG/ACT nasal spray, Place into both nostrils. (Patient not taking: Reported on 06/02/2023), Disp: , Rfl:    furosemide (LASIX) 20 MG tablet, Take 1 tablet (20 mg total) by mouth daily for 5 days., Disp: 5 tablet, Rfl: 0   gabapentin (NEURONTIN) 600 MG tablet, Take 1 tablet (600 mg total) by mouth 2 (two) times daily. (Patient taking differently: Take 600 mg by mouth at bedtime.), Disp: 180 tablet, Rfl: 1   hydrALAZINE (APRESOLINE) 25 MG tablet, Take 1 tablet (25 mg total) by mouth in the morning and at bedtime., Disp: 180 tablet, Rfl: 3   lisinopril (ZESTRIL) 10 MG tablet, Take 1 tablet (10 mg total) by mouth daily., Disp: 90 tablet, Rfl: 3   Multiple Vitamin (MULTIVITAMIN WITH MINERALS) TABS tablet, Take 1 tablet by mouth daily., Disp: 130 tablet, Rfl: 0   omeprazole (PRILOSEC) 20 MG capsule, TAKE 1 CAPSULE EVERY DAY, Disp: 90 capsule, Rfl: 3   polyethylene glycol powder (GLYCOLAX/MIRALAX) 17 GM/SCOOP powder, Take 17 g by mouth daily as needed for mild constipation., Disp: 238 g, Rfl: 0   senna-docusate (SENOKOT-S) 8.6-50 MG tablet, Take 1 tablet by mouth at bedtime as needed for mild constipation., Disp: 30 tablet, Rfl: 0  Current Facility-Administered Medications:    cyanocobalamin (VITAMIN B12) injection 1,000 mcg, 1,000 mcg, Intramuscular, Once, Crist Fat, MD   Review of Systems Review of Systems  Constitutional:  Negative for chills and fever.  Respiratory:  Negative for shortness of breath.   Cardiovascular:  Negative for chest pain.  Gastrointestinal:  Positive for constipation. Negative for abdominal pain, diarrhea, nausea and vomiting.  Musculoskeletal:  Positive for back pain, falls and joint pain.  Neurological:  Negative for dizziness, weakness and headaches.       Objective:    Vitals BP (!) 148/82   Temp 98.7 F (37.1 C)   Resp 17   Ht 5\' 9"  (1.753 m)   Wt 181  lb 9.6 oz (82.4 kg)   SpO2 96%   BMI 26.82 kg/m    Physical Examination Physical Exam Constitutional:      Appearance: Normal appearance. He is not ill-appearing.  Cardiovascular:     Rate and Rhythm: Normal rate and regular rhythm.     Pulses: Normal pulses.     Heart sounds: No murmur heard.    No friction rub. No gallop.  Pulmonary:     Effort: Pulmonary effort is normal. No respiratory distress.     Breath sounds:  No wheezing, rhonchi or rales.  Abdominal:     General: Abdomen is flat. Bowel sounds are normal. There is no distension.     Palpations: Abdomen is soft.     Tenderness: There is no abdominal tenderness.  Musculoskeletal:     Right lower leg: No edema.     Left lower leg: No edema.     Comments: He has pain over his right SI joint and over his right trochanter area when I palpate.  He has no midline spinal pain on palpation.  Skin:    General: Skin is warm and dry.     Findings: No rash.  Neurological:     Mental Status: He is alert.     Comments: His strength is 5/5 in his lower extremties.        Assessment & Plan:   DJD (degenerative joint disease) I asked him to stop his muscle relaxant.  He can continue his medrol dose pak and I want him to use celebrex and he will use tylenol as needed.  He has had problems with hydrocodone/APAP in the past with fecal and urinary retention.  I want to start him on tramadol 50mg  po q 12 hrs prn which he is only to take as needed.  He runs the risk of constipation and we will refill his senna-docusate which he states helps.      No follow-ups on file.   Crist Fat, MD

## 2023-12-05 NOTE — Assessment & Plan Note (Signed)
I asked him to stop his muscle relaxant.  He can continue his medrol dose pak and I want him to use celebrex and he will use tylenol as needed.  He has had problems with hydrocodone/APAP in the past with fecal and urinary retention.  I want to start him on tramadol 50mg  po q 12 hrs prn which he is only to take as needed.  He runs the risk of constipation and we will refill his senna-docusate which he states helps.

## 2023-12-23 ENCOUNTER — Ambulatory Visit: Payer: Medicare HMO | Admitting: Internal Medicine

## 2023-12-27 ENCOUNTER — Ambulatory Visit: Payer: Medicare Other | Admitting: Orthopedic Surgery

## 2023-12-27 ENCOUNTER — Other Ambulatory Visit (INDEPENDENT_AMBULATORY_CARE_PROVIDER_SITE_OTHER): Payer: Medicare Other

## 2023-12-27 VITALS — BP 194/69 | HR 58 | Ht 69.0 in | Wt 182.0 lb

## 2023-12-27 DIAGNOSIS — M545 Low back pain, unspecified: Secondary | ICD-10-CM

## 2023-12-27 DIAGNOSIS — M5416 Radiculopathy, lumbar region: Secondary | ICD-10-CM | POA: Diagnosis not present

## 2023-12-27 MED ORDER — HYDROCODONE-ACETAMINOPHEN 5-325 MG PO TABS: 1.0000 | ORAL_TABLET | Freq: Four times a day (QID) | ORAL | 0 refills | Status: AC | PRN

## 2023-12-27 NOTE — Progress Notes (Signed)
 Orthopedic Spine Surgery Office Note  Assessment: Patient is a 89 y.o. male with low back pain that radiates into the right lower extremity along the posterior aspect of the thigh and leg, suspect radiculopathy   Plan: -Explained that initially conservative treatment is tried as a significant number of patients may experience relief with these treatment modalities. Discussed that the conservative treatments include:  -activity modification  -physical therapy  -over the counter pain medications  -medrol  dosepak  -lumbar steroid injections -Patient has tried Tylenol , ibuprofen, gabapentin , Medrol  Dosepak, tramadol  -Recommended increasing his gabapentin  to 3 times a day at least while pain is severe.  Prescribed hydrocodone  for additional pain relief.  Explained that this is a narcotic pain medication so he should use this sparingly and that can lead to addiction -Patient has nearly been falling as a result of his leg giving out, so ordered an MRI of the lumbar spine to evaluate for radiculopathy -Patient should return to office in 4 weeks, x-rays at next visit: None   Patient expressed understanding of the plan and all questions were answered to the patient's satisfaction.   ___________________________________________________________________________   History:  Patient is a 89 y.o. male who presents today for lumbar spine.  Patient has been having about 6 weeks of low back pain that radiates into his right lower extremity.  He feels a goes along the posterior aspect of the thigh and leg to the level of the ankle.  He is not having any pain rating into the left lower extremity.  He states that this started after he was getting something out of the stove and it has gotten worse with time.  He has transition to using a walker and a wheelchair.  His right leg sometimes gives out when the pain is severe and has nearly caused him to fall several times.  Has had difficulty with sleeping at night as a  result of the pain.  He says for example last night that he got about 1 hour of sleep.   Weakness: Yes, right leg will give out and feel weak.  No other weakness noted Symptoms of imbalance: Yes, when the leg gives out, he feels off balance.  Has started to use a walker Paresthesias and numbness: Denies Bowel or bladder incontinence: Denies Saddle anesthesia: Denies  Treatments tried: Tylenol , ibuprofen, gabapentin , Medrol  Dosepak, tramadol   Review of systems: Denies fevers and chills, night sweats, unexplained weight loss, history of cancer.  Has had pain that wakes him at night  Past medical history: BPH Osteoporosis Insomnia HTN Depression  Allergies: NKDA  Past surgical history:  Cataract surgery Left intertrochanteric femur fracture rodding Left benign brain tumor excision Left humerus fracture ORIF  Social history: Denies use of nicotine product (smoking, vaping, patches, smokeless) Alcohol  use: yes, approximately 2 drinks per week Denies recreational drug use   Physical Exam:  BMI of 26.9  General: no acute distress, appears stated age Neurologic: alert, answering questions appropriately, following commands Respiratory: unlabored breathing on room air, symmetric chest rise Psychiatric: appropriate affect, normal cadence to speech   MSK (spine):  -Strength exam      Left  Right EHL    4+/5  4+/5 TA    5/5  5/5 GSC    5/5  5/5 Knee extension  5/5  5/5 Hip flexion   5/5  5/5  -Sensory exam    Sensation intact to light touch in L3-S1 nerve distributions of bilateral lower extremities  -Achilles DTR: 1/4 on the left, 1/4  on the right -Patellar tendon DTR: 1/4 on the left, 1/4 on the right  -Straight leg raise: Negative bilaterally -Femoral nerve stretch test: Negative bilaterally -Clonus: no beats bilaterally  -Left hip exam: No pain through range of motion, negative Stinchfield, negative FABER -Right hip exam: No pain through range of motion,  negative Stinchfield, negative FABER  Imaging: XRs of the lumbar spine from 12/27/2023 were independently reviewed and interpreted, showing chronic appearing compression fracture at L3 with anterior height loss.  No other fracture or dislocation seen.  Disc height loss at L2/3.  No other significant degenerative changes seen.  No evidence of instability on flexion/extension views.  Of note, L3 compression fracture was seen on films dating back in 2017  Patient name: Andrew Montgomery Patient MRN: [id number] Date of visit: 12/27/23

## 2023-12-30 ENCOUNTER — Ambulatory Visit: Payer: Medicare Other | Admitting: Internal Medicine

## 2023-12-30 ENCOUNTER — Encounter: Payer: Self-pay | Admitting: Internal Medicine

## 2023-12-30 VITALS — BP 144/80 | HR 63 | Temp 98.6°F | Resp 18 | Ht 69.0 in | Wt 180.6 lb

## 2023-12-30 DIAGNOSIS — M8000XD Age-related osteoporosis with current pathological fracture, unspecified site, subsequent encounter for fracture with routine healing: Secondary | ICD-10-CM

## 2023-12-30 DIAGNOSIS — I10 Essential (primary) hypertension: Secondary | ICD-10-CM

## 2023-12-30 DIAGNOSIS — G894 Chronic pain syndrome: Secondary | ICD-10-CM | POA: Diagnosis not present

## 2023-12-30 NOTE — Assessment & Plan Note (Signed)
 He did receive IV reclast on 10/2023.  We will need to repeat this again in 2025.

## 2023-12-30 NOTE — Assessment & Plan Note (Signed)
 His BP is slightly elevated.  We will continue to monitor.

## 2023-12-30 NOTE — Progress Notes (Signed)
 Office Visit  Subjective   Patient ID: Andrew Montgomery   DOB: 18-Apr-1932   Age: 89 y.o.   MRN: [id number]   Chief Complaint Chief Complaint  Patient presents with   Follow-up     History of Present Illness Andrew Montgomery returns for followup of his chronic pain.  Over the interim, he did go see Dr. Georgina in [place] with ortho spine.  They are arranging a MRI of his lumbar spine and in regards to his pain, they asked him to increase his dose of gabapentin  and wrote him for hydrocodone /APAP prn.  On his last visit with me, we started him on tramadol  but Dr. Georgina stopped it and started hydrocodone  as described.  The patient has had problems with urinary and fecal retention in the past from hydrocodone /APAP but he states he is taking it once at night and he is currently not having any problems.  He states it works for some time for his pain but then wears off.   He remains on gabapentin  where he was on gabapentin  600mg  BID but this was changed to TID.  He is no longer on diclofenac .  He has a history of radiculopathy of his lower extremities.  He states that since his surgery that he now has pain over his left femur/thigh area.  This is a sharp pain and he states his arthritis is also severe in his left knee.  He states ortho saw him in followup and saw his femur was healing well.  The patient also has arthritis which involves all his joints including his back, legs, hips, and shoulders.    He has not had to use Tylenol  prn for a good while.   He also has cervical foraminal stenosis with a history of neck pain but his neck pain resolved 5 years ago.   He had a MRI of his cervical spine performed on 06/02/2018 and this demonstrated foraminal stenosis of C4-C5, C5-C6, and C6-C7 with edematous facet arthropathy of his left C4-C5.  There is no new weakness/numbness or loss of bowel/bladder function.  His pain was a bit worse in his legs on his visit in 01/2020 and I increased his gabapentin  from 300mg  in AM and  600mg  in PM to 600mg  BID.  The patient does have a history of osteoporosis and a vertebral compression fracture.  He had a bone density performed on 12/2016 which showed a t-score of -2.1.  However, he has had a vertebral compression fracture which indicates he has osteoporosis.  He was on fosamax but this was stopped in 2023.  His Vit D level was normal on testing.   I did refer him to the neurosurgeon for evaluation for kyphoplasty.  Their notes noted that he has a healed compression fracture but they felt he was having neuropathy in his hands and feet and sent him to neurology for evaluation.  I saw him several years ago where he was having lower extremity pain and numbness which initially began in his left leg which had progressively worsened and involved both bilateral lower extremities.  I did a lab work up and sent him for EMG testing and this showed no evidence of a peripheral neuropathy.  He did have a chronic left L4 radiculopathy seen on EMG testing.  I did start him on gabapentin  at that time.  We also obtained a lumbar xray and this demonstrated a partial compression of the body of L3.  He states he had a fall in 2011 and  where he broke his ankle and hurt his arm.  He states he is not sure if this caused any problems with his back but he had no back pain at that time.  I wanted to set him up with [facility] for evaluation for kyphoplasty but the patient refused to go because he states no one explained to him what was going on.  I reviewed our chart and the nurse informed him what was going on and he had stated in her note that he did not want surgery and declined to go.  However he finally did go see neurology with Dr. Tobie.  I do not have her notes but the neurosurgeon felt he has a peripheral neuropathy.  On lab testing for his lower extremity numbness, we discovered that he had a Vit B12 defiency.  He does get VIt B12 injections done monthly.  The patient was sent for physical  therapy for his neuropathy for a few months and the patient quit going because he felt it was not helping.   The patient is a 89 year old Caucasian/White male who presents for followup of his osteoporosis. The patient does have a history of osteoporosis and a vertebral compression fracture.   Again, he was hospitalized from 06/02/2023 until 06/07/2023 where he had a left femur fracture.  He sustained a left intertrochanteric femur fracture and underwent cephalomedullary nail fixation done on 06/03/2023.He had a bone density performed on 12/2016 which showed a t-score of -2.1. However, he has had a vertebral compression fracture which indicates he has osteoporosis. We started him on fosamax which he is taking once a week. His Vit D level was normal on testing.SABRA He has a history of spinal fractures. He denies a family history of osteoporosis. Current risk factors: alcohol  consumption of more that 7 ounces per week. He denies the following: smoking, diabetes mellitus, high caffeine intake, daily prednisone use, hyperthyroidism, bowel resection, and gastric resection. He states he does not exercise routinely. A bone mineral density study was performed as described above in 12/2016 with a t-score of -2.1. We did repeat a bone density in 02/22/2021 and this showed a t-score of -2.3. I had a discussion with him and we stopped his Fosamax in 01/2022.  He did not want to do another bone density when I saw him in 02/2023 for an annual exam however we did finally get him to repeat a bone density on 07/17/2023 whic showed a femur neck t-score of -2.1 showing osteopenia.   We did check his Vit D levels at that time and they were normal with 53.1 value.  We did start him on iv reclast  where he received his first dose on 10/28/2023.  The patient is a 89 year old Caucasian/White male who presents for a follow-up evaluation of hypertension.  This past year, his BP was borderline where I switched his bisoprolol -HCT 5-6.25mg  to plain  bisoprolol  5mg  daily and chlorthalidone  25mg  daily. The patient has been checking his blood pressure at home where his his systlic blood pressure is running 130-140's.   The patient's current medications include: amlodipine  10 mg daily, lisinopril  40mg  daily, hydralazine  25mg  BID, bisoprolol  5mg  daily, and chlorthalidone  25mg  daily.  The patient has been tolerating his medications well. The patient denies any headache, visual changes, dizziness, lightheadness, chest pain, shortness of breath, weakness/numbness, and edema. He reports there have been no other symptoms noted.     Past Medical History Past Medical History:  Diagnosis Date  BPH (benign prostatic hyperplasia)    COLONIC POLYPS, HX OF 01/05/2010   Depression 07/06/2013   DJD (degenerative joint disease)    Essential hypertension    GERD (gastroesophageal reflux disease)    Insomnia 07/03/2011   Meningioma determined by biopsy of optic nerve (HCC)    Osteoarthritis    Osteoporosis    PSA, INCREASED 01/05/2010   Radiculopathy with lower extremity symptoms    Seasonal allergies    Vertebral compression fracture (HCC)    Vitamin B12 deficiency    Vitamin D  deficiency      Allergies No Known Allergies   Medications  Current Outpatient Medications:    acetaminophen  (TYLENOL ) 500 MG tablet, Take 2 tablets (1,000 mg total) by mouth every 6 (six) hours as needed for headache, fever or mild pain., Disp: , Rfl: 0   amLODipine  (NORVASC ) 5 MG tablet, Take 1 tablet (5 mg total) by mouth daily., Disp: 90 tablet, Rfl: 3   apixaban  (ELIQUIS ) 2.5 MG TABS tablet, Take 1 tablet (2.5 mg total) by mouth 2 (two) times daily., Disp: 60 tablet, Rfl: 0   bisoprolol  (ZEBETA ) 5 MG tablet, TAKE 1 TABLET EVERY DAY, Disp: 90 tablet, Rfl: 3   celecoxib  (CELEBREX ) 200 MG capsule, Take 1 capsule (200 mg total) by mouth 2 (two) times daily., Disp: 90 capsule, Rfl: 3   chlorthalidone  (HYGROTON ) 25 MG tablet, TAKE 1 TABLET EVERY DAY, Disp: 90 tablet, Rfl:  3   Cholecalciferol (VITAMIN D ) 50 MCG (2000 UT) CAPS, Take 2,000 Units by mouth daily., Disp: , Rfl:    feeding supplement (ENSURE ENLIVE / ENSURE PLUS) LIQD, Take 237 mLs by mouth 2 (two) times daily between meals., Disp: 237 mL, Rfl: 12   fluticasone (FLONASE) 50 MCG/ACT nasal spray, Place into both nostrils. (Patient not taking: Reported on 06/02/2023), Disp: , Rfl:    furosemide  (LASIX ) 20 MG tablet, Take 1 tablet (20 mg total) by mouth daily for 5 days., Disp: 5 tablet, Rfl: 0   gabapentin  (NEURONTIN ) 600 MG tablet, Take 1 tablet (600 mg total) by mouth 2 (two) times daily. (Patient taking differently: Take 600 mg by mouth at bedtime.), Disp: 180 tablet, Rfl: 1   hydrALAZINE  (APRESOLINE ) 25 MG tablet, Take 1 tablet (25 mg total) by mouth in the morning and at bedtime., Disp: 180 tablet, Rfl: 3   HYDROcodone -acetaminophen  (NORCO/VICODIN) 5-325 MG tablet, Take 1 tablet by mouth every 6 (six) hours as needed for up to 5 days for severe pain (pain score 7-10)., Disp: 20 tablet, Rfl: 0   lisinopril  (ZESTRIL ) 10 MG tablet, Take 1 tablet (10 mg total) by mouth daily., Disp: 90 tablet, Rfl: 3   Multiple Vitamin (MULTIVITAMIN WITH MINERALS) TABS tablet, Take 1 tablet by mouth daily., Disp: 130 tablet, Rfl: 0   omeprazole  (PRILOSEC) 20 MG capsule, TAKE 1 CAPSULE EVERY DAY, Disp: 90 capsule, Rfl: 3   polyethylene glycol powder (GLYCOLAX /MIRALAX ) 17 GM/SCOOP powder, Take 17 g by mouth daily as needed for mild constipation., Disp: 238 g, Rfl: 0   senna-docusate (SENOKOT-S) 8.6-50 MG tablet, Take 1 tablet by mouth at bedtime as needed for mild constipation., Disp: 90 tablet, Rfl: 1  Current Facility-Administered Medications:    cyanocobalamin  (VITAMIN B12) injection 1,000 mcg, 1,000 mcg, Intramuscular, Once, Fleeta Valeria Mayo, MD   Review of Systems Review of Systems  Constitutional:  Negative for chills and fever.  Eyes:  Negative for blurred vision and double vision.  Respiratory:  Negative for cough and  shortness of breath.  Cardiovascular:  Negative for chest pain, palpitations and leg swelling.  Gastrointestinal:  Negative for abdominal pain, constipation, diarrhea, nausea and vomiting.  Genitourinary:  Negative for frequency.  Musculoskeletal:  Positive for back pain. Negative for myalgias.  Skin:  Negative for itching and rash.  Neurological:  Negative for dizziness, weakness and headaches.  Endo/Heme/Allergies:  Negative for polydipsia.       Objective:    Vitals BP (!) 144/80   Pulse 63   Temp 98.6 F (37 C)   Resp 18   Ht 5' 9 (1.753 m)   Wt 180 lb 9.6 oz (81.9 kg)   SpO2 94%   BMI 26.67 kg/m    Physical Examination Physical Exam Constitutional:      Appearance: Normal appearance. He is not ill-appearing.  Cardiovascular:     Rate and Rhythm: Normal rate and regular rhythm.     Pulses: Normal pulses.     Heart sounds: No murmur heard.    No friction rub. No gallop.  Pulmonary:     Effort: Pulmonary effort is normal. No respiratory distress.     Breath sounds: No wheezing, rhonchi or rales.  Abdominal:     General: Abdomen is flat. Bowel sounds are normal. There is no distension.     Palpations: Abdomen is soft.     Tenderness: There is no abdominal tenderness.  Musculoskeletal:     Right lower leg: No edema.     Left lower leg: No edema.  Skin:    General: Skin is warm and dry.     Findings: No rash.  Neurological:     General: No focal deficit present.     Mental Status: He is alert and oriented to person, place, and time.  Psychiatric:        Mood and Affect: Mood normal.        Behavior: Behavior normal.        Assessment & Plan:   Essential hypertension His BP is slightly elevated.  We will continue to monitor.  Age-related osteoporosis with current pathological fracture He did receive IV reclast  on 10/2023.  We will need to repeat this again in 2025.  Chronic pain syndrome I reviewed his note from Dr. Trudy.  He is now on gabapentin   600mg  TID and uses hydrocodone /APAP as needed at night.  I asked him to take celebrex  as well.    Return in about 3 months (around 03/29/2024).   Selinda Fleeta Finger, MD

## 2023-12-30 NOTE — Assessment & Plan Note (Signed)
 I reviewed his note from Dr. Mayford Knife.  He is now on gabapentin 600mg  TID and uses hydrocodone/APAP as needed at night.  I asked him to take celebrex as well.

## 2024-01-07 ENCOUNTER — Ambulatory Visit: Payer: Medicare Other

## 2024-01-07 DIAGNOSIS — E538 Deficiency of other specified B group vitamins: Secondary | ICD-10-CM | POA: Diagnosis not present

## 2024-01-07 MED ORDER — CYANOCOBALAMIN 1000 MCG/ML IJ SOLN
1000.0000 ug | Freq: Once | INTRAMUSCULAR | Status: AC
  Administered 2024-08-11: 1000 ug via INTRAMUSCULAR

## 2024-01-07 NOTE — Progress Notes (Signed)
 Nurse Visit.

## 2024-01-17 DIAGNOSIS — M5416 Radiculopathy, lumbar region: Secondary | ICD-10-CM | POA: Diagnosis not present

## 2024-01-18 DIAGNOSIS — M4856XA Collapsed vertebra, not elsewhere classified, lumbar region, initial encounter for fracture: Secondary | ICD-10-CM | POA: Diagnosis not present

## 2024-02-03 ENCOUNTER — Other Ambulatory Visit: Payer: Self-pay

## 2024-02-03 MED ORDER — AMLODIPINE BESYLATE 5 MG PO TABS: 5.0000 mg | ORAL_TABLET | Freq: Every day | ORAL | 3 refills | Status: DC

## 2024-02-03 MED ORDER — CELECOXIB 200 MG PO CAPS: 200.0000 mg | ORAL_CAPSULE | Freq: Two times a day (BID) | ORAL | 3 refills | Status: DC

## 2024-02-03 NOTE — Progress Notes (Signed)
 Rx refill

## 2024-02-11 ENCOUNTER — Ambulatory Visit: Payer: Medicare Other

## 2024-02-11 VITALS — Wt 182.2 lb

## 2024-02-11 DIAGNOSIS — E538 Deficiency of other specified B group vitamins: Secondary | ICD-10-CM | POA: Diagnosis not present

## 2024-02-11 MED ORDER — CYANOCOBALAMIN 1000 MCG/ML IJ SOLN
1000.0000 ug | Freq: Once | INTRAMUSCULAR | Status: AC
  Administered 2024-02-11: 1000 ug via INTRAMUSCULAR

## 2024-02-11 NOTE — Progress Notes (Signed)
Nurse Visit B12

## 2024-02-26 ENCOUNTER — Ambulatory Visit: Payer: Medicare Other | Admitting: Orthopedic Surgery

## 2024-02-26 ENCOUNTER — Encounter: Payer: Self-pay | Admitting: Orthopedic Surgery

## 2024-02-26 VITALS — BP 186/67 | HR 56

## 2024-02-26 DIAGNOSIS — M5416 Radiculopathy, lumbar region: Secondary | ICD-10-CM

## 2024-02-26 NOTE — Progress Notes (Signed)
 Orthopedic Spine Surgery Office Note   Assessment: Patient is a 89 y.o. male with low back pain that radiates into the right lower extremity along the posterior aspect of the thigh and leg, suspect radiculopathy. Has L4/5 foraminal stenosis on the right     Plan: -Patient has tried Tylenol, ibuprofen, gabapentin, Medrol Dosepak, tramadol, hydrocodone -Explained his options at this point.  He is already taking narcotics for pain so there is not much room to go up in the medication department.  I explained his other options would be injection, surgery, pain management.  He wanted proceed with injection.  Referral provided to him today -Patient should return to office on an as needed basis     Patient expressed understanding of the plan and all questions were answered to the patient's satisfaction.    ___________________________________________________________________________     History:   Patient is a 89 y.o. male who presents today for follow up on his lumbar spine.  Patient continues to have low back pain that radiates into the right lower extremity.  He has not noticed any changes since his last office visit.  He feels the pain rating along the posterior aspect of the thigh and leg to the level of the ankle.  He is not having any pain radiating to the left lower extremity.  He still feels like his right leg will give out on him at times.  He is using a walker to ambulate.  He had used a cane prior to onset of this pain.   Treatments tried: Tylenol, ibuprofen, gabapentin, Medrol Dosepak, tramadol, hydrocodone     Physical Exam:   General: no acute distress, appears stated age Neurologic: alert, answering questions appropriately, following commands Respiratory: unlabored breathing on room air, symmetric chest rise Psychiatric: appropriate affect, normal cadence to speech     MSK (spine):   -Strength exam                                                   Left                   Right EHL                              4+/5                4+/5 TA                                 5/5                  5/5 GSC                             5/5                  5/5 Knee extension            5/5                  5/5 Hip flexion                    5/5  5/5   -Sensory exam                           Sensation intact to light touch in L3-S1 nerve distributions of bilateral lower extremities    Imaging: XRs of the lumbar spine from 12/27/2023 were previously independently reviewed and interpreted, showing chronic appearing compression fracture at L3 with anterior height loss.  No other fracture or dislocation seen.  Disc height loss at L2/3.  No other significant degenerative changes seen.  No evidence of instability on flexion/extension views.  Of note, L3 compression fracture was seen on films dating back in 2017  MRI of the lumbar spine (in [place]) from chronic L3 compression fracture.  Lateral recess stenosis at L4/5.  Right-sided foraminal stenosis at L4/5.  No other significant stenosis seen.    Patient name: Andrew Montgomery Patient MRN: [id number] Date of visit: 02/26/24

## 2024-03-11 ENCOUNTER — Other Ambulatory Visit: Payer: Self-pay

## 2024-03-11 ENCOUNTER — Ambulatory Visit: Payer: Medicare Other | Admitting: Internal Medicine

## 2024-03-11 ENCOUNTER — Ambulatory Visit: Admitting: Physical Medicine and Rehabilitation

## 2024-03-11 VITALS — BP 159/65 | HR 74

## 2024-03-11 DIAGNOSIS — M5416 Radiculopathy, lumbar region: Secondary | ICD-10-CM | POA: Diagnosis not present

## 2024-03-11 DIAGNOSIS — E538 Deficiency of other specified B group vitamins: Secondary | ICD-10-CM | POA: Diagnosis not present

## 2024-03-11 MED ORDER — METHYLPREDNISOLONE ACETATE 40 MG/ML IJ SUSP
40.0000 mg | Freq: Once | INTRAMUSCULAR | Status: AC
  Administered 2024-03-11: 40 mg

## 2024-03-11 MED ORDER — CYANOCOBALAMIN 1000 MCG/ML IJ SOLN
1000.0000 ug | Freq: Once | INTRAMUSCULAR | Status: AC
  Administered 2024-03-11: 1000 ug via INTRAMUSCULAR

## 2024-03-11 NOTE — Progress Notes (Signed)
 Pain Scale   Average Pain 8        +Driver, -+BT, -Dye Allergies.

## 2024-03-11 NOTE — Patient Instructions (Signed)

## 2024-03-11 NOTE — Progress Notes (Signed)
 Nurse visit  B12 shot

## 2024-03-18 NOTE — Progress Notes (Signed)
 Andrew Montgomery - 89 y.o. male MRN [id number]  Date of birth: 1932-08-06  Office Visit Note: Visit Date: 03/11/2024 PCP: Crist Fat, MD Referred by: London Sheer, MD  Subjective: Chief Complaint  Patient presents with   Lower Back - Pain   HPI:  Andrew Montgomery is a 89 y.o. male who comes in today at the request of Dr. Willia Craze for planned Right L4-5 Lumbar Transforaminal epidural steroid injection with fluoroscopic guidance.  The patient has failed conservative care including home exercise, medications, time and activity modification.  This injection will be diagnostic and hopefully therapeutic.  Please see requesting physician notes for further details and justification.   ROS Otherwise per HPI.  Assessment & Plan: Visit Diagnoses:    ICD-10-CM   1. Lumbar radiculopathy  M54.16 XR C-ARM NO REPORT    Epidural Steroid injection    methylPREDNISolone acetate (DEPO-MEDROL) injection 40 mg      Plan: No additional findings.   Meds & Orders:  Meds ordered this encounter  Medications   methylPREDNISolone acetate (DEPO-MEDROL) injection 40 mg    Orders Placed This Encounter  Procedures   XR C-ARM NO REPORT   Epidural Steroid injection    Follow-up: Return if symptoms worsen or fail to improve.   Procedures: No procedures performed  Lumbosacral Transforaminal Epidural Steroid Injection - Sub-Pedicular Approach with Fluoroscopic Guidance  Patient: Andrew Montgomery      Date of Birth: [birth year] MRN: [id number] PCP: Crist Fat, MD      Visit Date: 03/11/2024   Universal Protocol:    Date/Time: 03/11/2024  Consent Given By: the patient  Position: PRONE  Additional Comments: Vital signs were monitored before and after the procedure. Patient was prepped and draped in the usual sterile fashion. The correct patient, procedure, and site was verified.   Injection Procedure Details:   Procedure diagnoses: Lumbar radiculopathy [M54.16]    Meds  Administered:  Meds ordered this encounter  Medications   methylPREDNISolone acetate (DEPO-MEDROL) injection 40 mg    Laterality: Right  Location/Site: L4  Needle:5.0 in., 22 ga.  Short bevel or Quincke spinal needle  Needle Placement: Transforaminal  Findings:    -Comments: Excellent flow of contrast along the nerve, nerve root and into the epidural space.  Procedure Details: After squaring off the end-plates to get a true AP view, the C-arm was positioned so that an oblique view of the foramen as noted above was visualized. The target area is just inferior to the "nose of the scotty dog" or sub pedicular. The soft tissues overlying this structure were infiltrated with 2-3 ml. of 1% Lidocaine without Epinephrine.  The spinal needle was inserted toward the target using a "trajectory" view along the fluoroscope beam.  Under AP and lateral visualization, the needle was advanced so it did not puncture dura and was located close the 6 O'Clock position of the pedical in AP tracterory. Biplanar projections were used to confirm position. Aspiration was confirmed to be negative for CSF and/or blood. A 1-2 ml. volume of Isovue-250 was injected and flow of contrast was noted at each level. Radiographs were obtained for documentation purposes.   After attaining the desired flow of contrast documented above, a 0.5 to 1.0 ml test dose of 0.25% Marcaine was injected into each respective transforaminal space.  The patient was observed for 90 seconds post injection.  After no sensory deficits were reported, and normal lower extremity motor function was noted,   the above injectate  was administered so that equal amounts of the injectate were placed at each foramen (level) into the transforaminal epidural space.   Additional Comments:  No complications occurred Dressing: 2 x 2 sterile gauze and Band-Aid    Post-procedure details: Patient was observed during the procedure. Post-procedure instructions  were reviewed.  Patient left the clinic in stable condition.    Clinical History: MRI lumbar spine dated 01/18/2024 report in media tab.  L2-3 shows moderate canal stenosis Duda retropulsion at L3 with L3 compression fracture chronic.  There is also foraminal stenosis at L4-5 with severe facet arthropathy.  The stenosis is more prevalent on the right.     Objective:  VS:  HT:    WT:   BMI:     BP:(!) 159/65  HR:74bpm  TEMP: ( )  RESP:  Physical Exam Vitals and nursing note reviewed.  Constitutional:      General: He is not in acute distress.    Appearance: Normal appearance. He is not ill-appearing.  HENT:     Head: Normocephalic and atraumatic.     Right Ear: External ear normal.     Left Ear: External ear normal.     Nose: No congestion.  Eyes:     Extraocular Movements: Extraocular movements intact.  Cardiovascular:     Rate and Rhythm: Normal rate.     Pulses: Normal pulses.  Pulmonary:     Effort: Pulmonary effort is normal. No respiratory distress.  Abdominal:     General: There is no distension.     Palpations: Abdomen is soft.  Musculoskeletal:        General: No tenderness or signs of injury.     Cervical back: Neck supple.     Right lower leg: No edema.     Left lower leg: No edema.     Comments: Patient has good distal strength without clonus.  Skin:    Findings: No erythema or rash.  Neurological:     General: No focal deficit present.     Mental Status: He is alert and oriented to person, place, and time.     Sensory: No sensory deficit.     Motor: No weakness or abnormal muscle tone.     Coordination: Coordination normal.  Psychiatric:        Mood and Affect: Mood normal.        Behavior: Behavior normal.      Imaging: No results found.

## 2024-03-18 NOTE — Procedures (Signed)
 Lumbosacral Transforaminal Epidural Steroid Injection - Sub-Pedicular Approach with Fluoroscopic Guidance  Patient: Andrew Montgomery      Date of Birth: [birth year] MRN: [id number] PCP: Crist Fat, MD      Visit Date: 03/11/2024   Universal Protocol:    Date/Time: 03/11/2024  Consent Given By: the patient  Position: PRONE  Additional Comments: Vital signs were monitored before and after the procedure. Patient was prepped and draped in the usual sterile fashion. The correct patient, procedure, and site was verified.   Injection Procedure Details:   Procedure diagnoses: Lumbar radiculopathy [M54.16]    Meds Administered:  Meds ordered this encounter  Medications   methylPREDNISolone acetate (DEPO-MEDROL) injection 40 mg    Laterality: Right  Location/Site: L4  Needle:5.0 in., 22 ga.  Short bevel or Quincke spinal needle  Needle Placement: Transforaminal  Findings:    -Comments: Excellent flow of contrast along the nerve, nerve root and into the epidural space.  Procedure Details: After squaring off the end-plates to get a true AP view, the C-arm was positioned so that an oblique view of the foramen as noted above was visualized. The target area is just inferior to the "nose of the scotty dog" or sub pedicular. The soft tissues overlying this structure were infiltrated with 2-3 ml. of 1% Lidocaine without Epinephrine.  The spinal needle was inserted toward the target using a "trajectory" view along the fluoroscope beam.  Under AP and lateral visualization, the needle was advanced so it did not puncture dura and was located close the 6 O'Clock position of the pedical in AP tracterory. Biplanar projections were used to confirm position. Aspiration was confirmed to be negative for CSF and/or blood. A 1-2 ml. volume of Isovue-250 was injected and flow of contrast was noted at each level. Radiographs were obtained for documentation purposes.   After attaining the desired flow  of contrast documented above, a 0.5 to 1.0 ml test dose of 0.25% Marcaine was injected into each respective transforaminal space.  The patient was observed for 90 seconds post injection.  After no sensory deficits were reported, and normal lower extremity motor function was noted,   the above injectate was administered so that equal amounts of the injectate were placed at each foramen (level) into the transforaminal epidural space.   Additional Comments:  No complications occurred Dressing: 2 x 2 sterile gauze and Band-Aid    Post-procedure details: Patient was observed during the procedure. Post-procedure instructions were reviewed.  Patient left the clinic in stable condition.

## 2024-03-27 ENCOUNTER — Other Ambulatory Visit: Payer: Self-pay | Admitting: Internal Medicine

## 2024-03-30 ENCOUNTER — Ambulatory Visit: Payer: Medicare Other | Admitting: Internal Medicine

## 2024-03-30 ENCOUNTER — Encounter: Payer: Self-pay | Admitting: Internal Medicine

## 2024-03-30 VITALS — BP 113/62 | HR 71 | Temp 98.7°F | Resp 16 | Ht 69.0 in | Wt 176.4 lb

## 2024-03-30 DIAGNOSIS — S32000D Wedge compression fracture of unspecified lumbar vertebra, subsequent encounter for fracture with routine healing: Secondary | ICD-10-CM

## 2024-03-30 DIAGNOSIS — M150 Primary generalized (osteo)arthritis: Secondary | ICD-10-CM | POA: Diagnosis not present

## 2024-03-30 DIAGNOSIS — G894 Chronic pain syndrome: Secondary | ICD-10-CM | POA: Diagnosis not present

## 2024-03-30 DIAGNOSIS — I10 Essential (primary) hypertension: Secondary | ICD-10-CM | POA: Diagnosis not present

## 2024-03-30 DIAGNOSIS — M5416 Radiculopathy, lumbar region: Secondary | ICD-10-CM | POA: Insufficient documentation

## 2024-03-30 MED ORDER — HYDROCODONE-ACETAMINOPHEN 5-325 MG PO TABS: 1.0000 | ORAL_TABLET | Freq: Every evening | ORAL | 0 refills | Status: DC | PRN

## 2024-03-30 MED ORDER — GABAPENTIN 600 MG PO TABS
600.0000 mg | ORAL_TABLET | Freq: Three times a day (TID) | ORAL | 3 refills | Status: DC
Start: 2024-03-30 — End: 2024-10-02

## 2024-03-30 NOTE — Assessment & Plan Note (Signed)
 The patient is s/p lumbar ESI this past month which has helped his pain a lot.  He is no longer using a walker but uses a 1 point cane.  I reviewed his notes from ortho and from Dr. Alvester Morin who did his ESI.  We will continue on his gabapentin, celebrex and hydrocodone/APAP at this time.  Ortho told him to come back if the steriod injection wears off.  I reviewed his  PDMP/ [facility] CSR.

## 2024-03-30 NOTE — Assessment & Plan Note (Signed)
 I want him to continue to use celebrex and and he can use tylenol as needed.

## 2024-03-30 NOTE — Assessment & Plan Note (Signed)
 His BP seems to be doing well today.  We will continue his current medications.

## 2024-03-30 NOTE — Progress Notes (Signed)
 Office Visit  Subjective   Patient ID: Andrew Montgomery   DOB: [birth year]   Age: 89 y.o.   MRN: [id number]   Chief Complaint No chief complaint on file.    History of Present Illness The patient is a 89 year old Caucasian/White male who presents for a follow-up evaluation of hypertension.  On his last visit, his BP was elevated.  This past year, his BP was borderline where I switched his bisoprolol-HCT 5-6.25mg  to plain bisoprolol 5mg  daily and chlorthalidone 25mg  daily. The patient has been checking his blood pressure at home.  His SBP ranges 140-150's.   The patient's current medications include: amlodipine 10 mg daily, lisinopril 40mg  daily, hydralazine 25mg  BID, bisoprolol 5mg  daily, and chlorthalidone 25mg  daily.  The patient has been tolerating his medications well. The patient denies any headache, visual changes, dizziness, lightheadness, chest pain, shortness of breath, weakness/numbness, and edema. He reports there have been no other symptoms noted.  Andrew Montgomery returns for followup of his chronic pain.  Over the interim, Dr. Christell Constant in [place] with ortho spine performed a MRI of lumbar spine on 01/17/2024 which showed chronic compression fracture of L3 with more than 80% loss of central body height. Large lipid poor hemangioma at L4. Degenerative changes are most prominent at L4-L5 with severe foraminal stenosis, compression of the exiting nerve root and interspinous bursitis.  Dr. Alvester Morin performed a lumbar ESI on 03/11/2024.  The patient states that he has a sharp pain in his right hip that radiated down his right leg.  This ESI took the sharp pain away and now has a dull aching pain.   The patient states that this took away about 75% of his pain and he has now stopped using his walker since his pain is better controlled.  Again, Dr. Christell Constant had seen him several months ago where the patient's pain had increased.  They asked him to increase his dose of gabapentin and wrote him for hydrocodone/APAP  prn.  I had previously tried him on tramadol but Dr. Christell Constant stopped it and started hydrocodone as described.  The patient has had problems with urinary and fecal retention in the past from hydrocodone/APAP but he states he is taking it once at night and he is currently not having any problems.  He states this controls his pain at night and he can rest.   He remains on gabapentin 600mg  BID and sometimes he takes it TID.  He is no longer on diclofenac and I started him on celebrex but he has not noticed much difference with it.  He has a history of radiculopathy of his lower extremities.  He states that since his surgery that he now has pain over his left femur/thigh area.  This is a sharp pain and he states his arthritis is also severe in his left knee.  He states ortho saw him in followup and saw his femur was healing well.  The patient also has arthritis which involves all his joints including his back, legs, hips, and shoulders.    He has not had to use Tylenol prn for a "good while".   He also has cervical foraminal stenosis with a history of neck pain but his neck pain resolved 5 years ago.   He had a MRI of his cervical spine performed on 06/02/2018 and this demonstrated foraminal stenosis of C4-C5, C5-C6, and C6-C7 with edematous facet arthropathy of his left C4-C5.  There is no new weakness/numbness or loss of bowel/bladder function.  His pain was a bit worse in his legs on his visit in 01/2020 and I increased his gabapentin from 300mg  in AM and 600mg  in PM to 600mg  BID.  The patient does have a history of osteoporosis and a vertebral compression fracture.  He had a bone density performed on 12/2016 which showed a t-score of -2.1.  However, he has had a vertebral compression fracture which indicates he has osteoporosis.  He was on fosamax but this was stopped in 2023.  His Vit D level was normal on testing.   I did refer him to the neurosurgeon for evaluation for kyphoplasty.  Their notes noted that he has a  healed compression fracture but they felt he was having neuropathy in his hands and feet and sent him to neurology for evaluation.  I saw him several years ago where he was having lower extremity pain and numbness which initially began in his left leg which had progressively worsened and involved both bilateral lower extremities.  I did a lab work up and sent him for EMG testing and this showed no evidence of a peripheral neuropathy.  He did have a chronic left L4 radiculopathy seen on EMG testing.  I did start him on gabapentin at that time.  We also obtained a lumbar xray and this demonstrated a partial compression of the body of L3.  He states he had a fall in 2011 and where he broke his ankle and hurt his arm.  He states he is not sure if this caused any problems with his back but he had no back pain at that time.  I wanted to set him up with [facility] for evaluation for kyphoplasty but the patient refused to go because he states no one explained to him what was going on.  I reviewed our chart and the nurse informed him what was going on and he had stated in her note that he did not want surgery and declined to go.  However he finally did go see neurology with Dr. Allena Katz.  I do not have her notes but the neurosurgeon felt he has a peripheral neuropathy.  On lab testing for his lower extremity numbness, we discovered that he had a Vit B12 defiency.  He does get VIt B12 injections done monthly.  The patient was sent for physical therapy for his neuropathy for a few months and the patient quit going because he felt it was not helping.   The patient also has arthritis which involves all his joints including his back, legs, hips, and shoulders. His worse pain is in his right hip and he was having problems with right shoulder pain but this improved.  He also has arthritis in his fingers.  He is currently on celebrex 200mg  daily.  He states that if he gets pain with his joints, he used to use   diclofenac for 3-4 days then stop.  He has not had to use Tylenol prn for a "good while".        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)    Osteoarthritis    Osteoporosis    PSA, INCREASED 01/05/2010   Radiculopathy with lower extremity  symptoms    Seasonal allergies    Vertebral compression fracture (HCC)    Vitamin B12 deficiency    Vitamin D deficiency      Allergies No Known Allergies   Medications  Current Outpatient Medications:    acetaminophen (TYLENOL) 500 MG tablet, Take 2 tablets (1,000 mg total) by mouth every 6 (six) hours as needed for headache, fever or mild pain., Disp: , Rfl: 0   amLODipine (NORVASC) 5 MG tablet, Take 1 tablet (5 mg total) by mouth daily., Disp: 90 tablet, Rfl: 3   bisoprolol (ZEBETA) 5 MG tablet, TAKE 1 TABLET EVERY DAY, Disp: 90 tablet, Rfl: 3   celecoxib (CELEBREX) 200 MG capsule, Take 1 capsule (200 mg total) by mouth 2 (two) times daily., Disp: 90 capsule, Rfl: 3   chlorthalidone (HYGROTON) 25 MG tablet, TAKE 1 TABLET EVERY DAY, Disp: 90 tablet, Rfl: 3   Cholecalciferol (VITAMIN D) 50 MCG (2000 UT) CAPS, Take 2,000 Units by mouth daily., Disp: , Rfl:    feeding supplement (ENSURE ENLIVE / ENSURE PLUS) LIQD, Take 237 mLs by mouth 2 (two) times daily between meals., Disp: 237 mL, Rfl: 12   fluticasone (FLONASE) 50 MCG/ACT nasal spray, Place into both nostrils. (Patient not taking: Reported on 06/02/2023), Disp: , Rfl:    gabapentin (NEURONTIN) 600 MG tablet, TAKE 1 TABLET BY MOUTH TWICE  DAILY, Disp: 200 tablet, Rfl: 2   hydrALAZINE (APRESOLINE) 25 MG tablet, TAKE 1 TABLET BY MOUTH TWICE  DAILY WITH FOOD, Disp: 200 tablet, Rfl: 2   lisinopril  (ZESTRIL) 40 MG tablet, TAKE 1 TABLET BY MOUTH DAILY, Disp: 100 tablet, Rfl: 2   Multiple Vitamin (MULTIVITAMIN WITH MINERALS) TABS tablet, Take 1 tablet by mouth daily., Disp: 130 tablet, Rfl: 0   omeprazole (PRILOSEC) 20 MG capsule, TAKE 1 CAPSULE EVERY DAY, Disp: 90 capsule, Rfl: 3   polyethylene glycol powder (GLYCOLAX/MIRALAX) 17 GM/SCOOP powder, Take 17 g by mouth daily as needed for mild constipation., Disp: 238 g, Rfl: 0   senna-docusate (SENOKOT-S) 8.6-50 MG tablet, Take 1 tablet by mouth at bedtime as needed for mild constipation., Disp: 90 tablet, Rfl: 1  Current Facility-Administered Medications:    cyanocobalamin (VITAMIN B12) injection 1,000 mcg, 1,000 mcg, Intramuscular, Once, [place] Reader, Barbara Cower, MD   cyanocobalamin (VITAMIN B12) injection 1,000 mcg, 1,000 mcg, Intramuscular, Once, Crist Fat, MD   Review of Systems Review of Systems  Constitutional:  Negative for chills and fever.  Eyes:  Negative for blurred vision and double vision.  Respiratory:  Negative for cough and shortness of breath.   Cardiovascular:  Negative for chest pain, palpitations and leg swelling.  Gastrointestinal:  Positive for constipation. Negative for abdominal pain, diarrhea, nausea and vomiting.  Genitourinary:  Negative for frequency.  Musculoskeletal:  Positive for back pain. Negative for myalgias.  Skin:  Negative for itching and rash.  Neurological:  Negative for dizziness, weakness and headaches.  Endo/Heme/Allergies:  Negative for polydipsia.       Objective:    Vitals BP 113/62   Pulse 71   Temp 98.7 F (37.1 C)   Resp 16   Ht 5\' 9"  (1.753 m)   Wt 176 lb 6.4 oz (80 kg)   SpO2 99%   BMI 26.05 kg/m    Physical Examination Physical Exam Constitutional:      Appearance: Normal appearance. He is not ill-appearing.  Cardiovascular:     Rate and Rhythm: Normal rate and regular rhythm.     Pulses: Normal pulses.  Heart sounds: No murmur heard.    No friction rub. No gallop.   Pulmonary:     Effort: Pulmonary effort is normal. No respiratory distress.     Breath sounds: No wheezing, rhonchi or rales.  Abdominal:     General: Abdomen is flat. Bowel sounds are normal. There is no distension.     Palpations: Abdomen is soft.     Tenderness: There is no abdominal tenderness.  Musculoskeletal:     Right lower leg: No edema.     Left lower leg: No edema.  Skin:    General: Skin is warm and dry.     Findings: No rash.  Neurological:     General: No focal deficit present.     Mental Status: He is alert and oriented to person, place, and time.  Psychiatric:        Mood and Affect: Mood normal.        Behavior: Behavior normal.        Assessment & Plan:   Essential hypertension His BP seems to be doing well today.  We will continue his current medications.  Lumbar radiculopathy Plan as below.  Compression fracture of lumbar vertebra with routine healing Plan as below.  Chronic pain syndrome The patient is s/p lumbar ESI this past month which has helped his pain a lot.  He is no longer using a walker but uses a 1 point cane.  I reviewed his notes from ortho and from Dr. Alvester Morin who did his ESI.  We will continue on his gabapentin, celebrex and hydrocodone/APAP at this time.  Ortho told him to come back if the steriod injection wears off.  I reviewed his  PDMP/  CSR.  Primary osteoarthritis involving multiple joints I want him to continue to use celebrex and and he can use tylenol as needed.    Return in about 3 months (around 06/29/2024) for annual.   Crist Fat, MD

## 2024-03-30 NOTE — Assessment & Plan Note (Signed)
 Plan as below.

## 2024-04-12 ENCOUNTER — Other Ambulatory Visit: Payer: Self-pay | Admitting: Internal Medicine

## 2024-04-13 ENCOUNTER — Ambulatory Visit: Admitting: Internal Medicine

## 2024-04-13 DIAGNOSIS — E538 Deficiency of other specified B group vitamins: Secondary | ICD-10-CM | POA: Diagnosis not present

## 2024-04-13 MED ORDER — CYANOCOBALAMIN 1000 MCG/ML IJ SOLN
1000.0000 ug | Freq: Once | INTRAMUSCULAR | Status: AC
  Administered 2024-04-13: 1000 ug via INTRAMUSCULAR

## 2024-04-13 NOTE — Progress Notes (Signed)
 Nurse Visit/B 12 Injection

## 2024-05-14 ENCOUNTER — Ambulatory Visit: Admitting: Internal Medicine

## 2024-05-14 DIAGNOSIS — E538 Deficiency of other specified B group vitamins: Secondary | ICD-10-CM

## 2024-05-14 MED ORDER — CYANOCOBALAMIN 1000 MCG/ML IJ SOLN
1000.0000 ug | Freq: Once | INTRAMUSCULAR | Status: AC
  Administered 2024-05-14: 1000 ug via INTRAMUSCULAR

## 2024-05-14 NOTE — Progress Notes (Signed)
Nurse visit B12 injection.

## 2024-05-19 ENCOUNTER — Other Ambulatory Visit: Payer: Self-pay

## 2024-05-19 MED ORDER — LISINOPRIL 40 MG PO TABS
40.0000 mg | ORAL_TABLET | Freq: Every day | ORAL | 2 refills | Status: DC
Start: 2024-05-19 — End: 2024-09-15

## 2024-05-19 MED ORDER — HYDRALAZINE HCL 25 MG PO TABS: 25.0000 mg | ORAL_TABLET | Freq: Two times a day (BID) | ORAL | 2 refills | Status: DC

## 2024-05-19 NOTE — Progress Notes (Signed)
 Rx Refill

## 2024-06-12 ENCOUNTER — Telehealth: Payer: Self-pay | Admitting: Physical Medicine and Rehabilitation

## 2024-06-12 NOTE — Telephone Encounter (Signed)
 Patient called and ask to be put on the schedule for back injection. CB#[phone]

## 2024-06-16 ENCOUNTER — Ambulatory Visit

## 2024-06-16 NOTE — Telephone Encounter (Signed)
 Scheduled for 06/19/24 @ 215

## 2024-06-17 ENCOUNTER — Ambulatory Visit: Admitting: Internal Medicine

## 2024-06-17 DIAGNOSIS — E538 Deficiency of other specified B group vitamins: Secondary | ICD-10-CM

## 2024-06-17 NOTE — Progress Notes (Signed)
 Nurse Visit B12 Injection, Left Deltoid

## 2024-06-19 ENCOUNTER — Ambulatory Visit: Admitting: Orthopedic Surgery

## 2024-06-19 DIAGNOSIS — M5416 Radiculopathy, lumbar region: Secondary | ICD-10-CM | POA: Diagnosis not present

## 2024-06-19 MED ORDER — GABAPENTIN 100 MG PO CAPS: 100.0000 mg | ORAL_CAPSULE | Freq: Three times a day (TID) | ORAL | 2 refills | Status: DC

## 2024-06-19 NOTE — Progress Notes (Signed)
 Orthopedic Spine Surgery Office Note   Assessment: Patient is a 89 y.o. male with low back pain that radiates into the right lower extremity along the posterior aspect of the thigh and leg. Has L4/5 foraminal stenosis on the right causing radiculopathy     Plan: -Patient has tried Tylenol , ibuprofen, gabapentin , Medrol  Dosepak, tramadol , hydrocodone , lumbar steroid injection -Discussed surgery as an option for him since he has tried all the conservative treatments that are typically used and his pain remains.  He is not interested in any kind of surgery so will not bring that up going forward -I discussed the remaining options.  He has tried multiple medications including narcotics.  I suggested increasing his gabapentin  to see if that would help.  I prescribed him the 100 mg tablets so he can take 700 mg 3 times daily.  He is currently taking 600 mg 3 times daily -Since he did get relief with the injection in the past, we discussed repeating that again as well.  A referral was provided to him today for that -Patient should return to office on an as needed basis     Patient expressed understanding of the plan and all questions were answered to the patient's satisfaction.    ___________________________________________________________________________     History:   Patient is a 89 y.o. male who presents today for follow up on his lumbar spine.  Patient did well after a injection to his lumbar spine.  He noted significant pain relief.  Pain has gradually returned.  He feels it in the same distribution as before going along the posterior aspect of the thigh and leg to the level of the ankle on the right side.  He is still not having any pain radiating into the left lower extremity.  He is taking gabapentin  600 mg 3 times daily to help with the pain.  He has also tried using multiple over-the-counter medications and narcotics.   Treatments tried: Tylenol , ibuprofen, gabapentin , Medrol  Dosepak,  tramadol , hydrocodone , lumbar steroid injections     Physical Exam:   General: no acute distress, appears stated age Neurologic: alert, answering questions appropriately, following commands Respiratory: unlabored breathing on room air, symmetric chest rise Psychiatric: appropriate affect, normal cadence to speech     MSK (spine):   -Strength exam                                                   Left                  Right EHL                              4+/5                4+/5 TA                                 5/5                  5/5 GSC                             5/5  5/5 Knee extension            5/5                  5/5 Hip flexion                    5/5                  5/5   -Sensory exam                           Sensation intact to light touch in L3-S1 nerve distributions of bilateral lower extremities     Imaging: XRs of the lumbar spine from 12/27/2023 were previously independently reviewed and interpreted, showing chronic appearing compression fracture at L3 with anterior height loss.  No other fracture or dislocation seen.  Disc height loss at L2/3.  No other significant degenerative changes seen.  No evidence of instability on flexion/extension views.  Of note, L3 compression fracture was seen on films dating back in 2017   MRI of the lumbar spine (in [place]) from chronic L3 compression fracture.  Lateral recess stenosis at L4/5.  Right-sided foraminal stenosis at L4/5.  No other significant stenosis seen.     Patient name: Andrew Montgomery Patient MRN: [id number] Date of visit: 06/19/24

## 2024-06-25 ENCOUNTER — Other Ambulatory Visit: Payer: Self-pay | Admitting: Internal Medicine

## 2024-07-03 NOTE — Discharge Instructions (Signed)

## 2024-07-06 ENCOUNTER — Inpatient Hospital Stay
Admission: RE | Admit: 2024-07-06 | Discharge: 2024-07-06 | Disposition: A | Discharge status: Home / Self Care | Source: Ambulatory Visit | Attending: Orthopedic Surgery

## 2024-07-06 DIAGNOSIS — M4727 Other spondylosis with radiculopathy, lumbosacral region: Secondary | ICD-10-CM | POA: Diagnosis not present

## 2024-07-06 DIAGNOSIS — M5416 Radiculopathy, lumbar region: Secondary | ICD-10-CM

## 2024-07-06 DIAGNOSIS — M48061 Spinal stenosis, lumbar region without neurogenic claudication: Secondary | ICD-10-CM | POA: Diagnosis not present

## 2024-07-06 MED ORDER — METHYLPREDNISOLONE ACETATE 40 MG/ML INJ SUSP (RADIOLOG
80.0000 mg | Freq: Once | INTRAMUSCULAR | Status: AC
  Administered 2024-07-06: 80 mg via EPIDURAL

## 2024-07-06 MED ORDER — IOPAMIDOL (ISOVUE-M 200) INJECTION 41%
1.0000 mL | Freq: Once | INTRAMUSCULAR | Status: AC
  Administered 2024-07-06: 1 mL via EPIDURAL

## 2024-07-08 ENCOUNTER — Ambulatory Visit: Admitting: Internal Medicine

## 2024-07-08 ENCOUNTER — Encounter: Payer: Self-pay | Admitting: Internal Medicine

## 2024-07-08 VITALS — BP 120/70 | HR 42 | Temp 97.6°F | Resp 18 | Ht 69.0 in | Wt 172.4 lb

## 2024-07-08 DIAGNOSIS — E559 Vitamin D deficiency, unspecified: Secondary | ICD-10-CM

## 2024-07-08 DIAGNOSIS — Z0000 Encounter for general adult medical examination without abnormal findings: Secondary | ICD-10-CM | POA: Diagnosis not present

## 2024-07-08 DIAGNOSIS — I10 Essential (primary) hypertension: Secondary | ICD-10-CM

## 2024-07-08 DIAGNOSIS — E538 Deficiency of other specified B group vitamins: Secondary | ICD-10-CM | POA: Diagnosis not present

## 2024-07-08 DIAGNOSIS — D329 Benign neoplasm of meninges, unspecified: Secondary | ICD-10-CM | POA: Diagnosis not present

## 2024-07-08 DIAGNOSIS — G894 Chronic pain syndrome: Secondary | ICD-10-CM

## 2024-07-08 DIAGNOSIS — M150 Primary generalized (osteo)arthritis: Secondary | ICD-10-CM | POA: Diagnosis not present

## 2024-07-08 DIAGNOSIS — M8000XD Age-related osteoporosis with current pathological fracture, unspecified site, subsequent encounter for fracture with routine healing: Secondary | ICD-10-CM | POA: Diagnosis not present

## 2024-07-08 DIAGNOSIS — R739 Hyperglycemia, unspecified: Secondary | ICD-10-CM

## 2024-07-08 DIAGNOSIS — M5416 Radiculopathy, lumbar region: Secondary | ICD-10-CM

## 2024-07-08 DIAGNOSIS — Z6825 Body mass index (BMI) 25.0-25.9, adult: Secondary | ICD-10-CM | POA: Diagnosis not present

## 2024-07-08 DIAGNOSIS — J309 Allergic rhinitis, unspecified: Secondary | ICD-10-CM

## 2024-07-08 DIAGNOSIS — J302 Other seasonal allergic rhinitis: Secondary | ICD-10-CM

## 2024-07-08 DIAGNOSIS — N400 Enlarged prostate without lower urinary tract symptoms: Secondary | ICD-10-CM | POA: Insufficient documentation

## 2024-07-08 DIAGNOSIS — K219 Gastro-esophageal reflux disease without esophagitis: Secondary | ICD-10-CM

## 2024-07-08 DIAGNOSIS — S32000D Wedge compression fracture of unspecified lumbar vertebra, subsequent encounter for fracture with routine healing: Secondary | ICD-10-CM

## 2024-07-08 NOTE — Assessment & Plan Note (Signed)
 He will need a repeat reclast  injection in 10/2024.

## 2024-07-08 NOTE — Assessment & Plan Note (Signed)
 He is on celebrex .  He can supplement with tylenol  as needed.

## 2024-07-08 NOTE — Assessment & Plan Note (Signed)
 He remains on omeprazole  at this time.

## 2024-07-08 NOTE — Assessment & Plan Note (Signed)
 He denies any urinary problems today.

## 2024-07-08 NOTE — Assessment & Plan Note (Signed)
We will check his levels today.

## 2024-07-08 NOTE — Assessment & Plan Note (Signed)
 We discussed in 2024 that he does not want any referrals to radiation oncology and he does not want a repeat MRI.  He understands that he could have worsening of his meningioma and symptoms but does not want anything done

## 2024-07-08 NOTE — Assessment & Plan Note (Signed)
 His BP is controlled.  WE will cotninue his current medications.

## 2024-07-08 NOTE — Assessment & Plan Note (Signed)
 He had his 2nd ESI of his lumbar spine done a few days ago.  He now off hydrocodone .  He has arthritis and lumbar stenosis/radiculopathy.  He is now just on gabapentin .

## 2024-07-08 NOTE — Assessment & Plan Note (Signed)
 I want him to eat healthy and try to be active.

## 2024-07-08 NOTE — Assessment & Plan Note (Signed)
 Plan as below.

## 2024-07-08 NOTE — Progress Notes (Signed)
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 with a written personalized care plan for preventive services.     Selinda Fleeta Finger, MD   07/08/2024

## 2024-07-08 NOTE — Assessment & Plan Note (Signed)
 He can use claritin D as needed.

## 2024-07-09 LAB — CBC WITH DIFFERENTIAL/PLATELET
Basophils Absolute: 0 x10E3/uL (ref 0.0–0.2)
Basos: 0 %
EOS (ABSOLUTE): 0 x10E3/uL (ref 0.0–0.4)
Eos: 0 %
Hematocrit: 36.7 % — ABNORMAL LOW (ref 37.5–51.0)
Hemoglobin: 12.1 g/dL — ABNORMAL LOW (ref 13.0–17.7)
Immature Grans (Abs): 0 x10E3/uL (ref 0.0–0.1)
Immature Granulocytes: 0 %
Lymphocytes Absolute: 1.9 x10E3/uL (ref 0.7–3.1)
Lymphs: 23 %
MCH: 33.4 pg — ABNORMAL HIGH (ref 26.6–33.0)
MCHC: 33 g/dL (ref 31.5–35.7)
MCV: 101 fL — ABNORMAL HIGH (ref 79–97)
Monocytes Absolute: 0.5 x10E3/uL (ref 0.1–0.9)
Monocytes: 6 %
Neutrophils Absolute: 5.7 x10E3/uL (ref 1.4–7.0)
Neutrophils: 71 %
Platelets: 266 x10E3/uL (ref 150–450)
RBC: 3.62 x10E6/uL — ABNORMAL LOW (ref 4.14–5.80)
RDW: 12 % (ref 11.6–15.4)
WBC: 8.2 x10E3/uL (ref 3.4–10.8)

## 2024-07-09 LAB — CMP14 + ANION GAP
ALT: 9 IU/L (ref 0–44)
AST: 19 IU/L (ref 0–40)
Albumin: 4.4 g/dL (ref 3.6–4.6)
Alkaline Phosphatase: 68 IU/L (ref 44–121)
Anion Gap: 14 mmol/L (ref 10.0–18.0)
BUN/Creatinine Ratio: 25 — ABNORMAL HIGH (ref 10–24)
BUN: 22 mg/dL (ref 10–36)
Bilirubin Total: 0.4 mg/dL (ref 0.0–1.2)
CO2: 22 mmol/L (ref 20–29)
Calcium: 9.6 mg/dL (ref 8.6–10.2)
Chloride: 98 mmol/L (ref 96–106)
Creatinine, Ser: 0.88 mg/dL (ref 0.76–1.27)
Globulin, Total: 2.5 g/dL (ref 1.5–4.5)
Glucose: 92 mg/dL (ref 70–99)
Potassium: 5.2 mmol/L (ref 3.5–5.2)
Sodium: 134 mmol/L (ref 134–144)
Total Protein: 6.9 g/dL (ref 6.0–8.5)
eGFR: 82 mL/min/1.73 (ref >59)

## 2024-07-09 LAB — LIPID PANEL
Chol/HDL Ratio: 2.5 ratio (ref 0.0–5.0)
Cholesterol, Total: 171 mg/dL (ref 100–199)
HDL: 69 mg/dL (ref >39)
LDL Chol Calc (NIH): 92 mg/dL (ref 0–99)
Triglycerides: 50 mg/dL (ref 0–149)
VLDL Cholesterol Cal: 10 mg/dL (ref 5–40)

## 2024-07-09 LAB — TSH: TSH: 2.02 u[IU]/mL (ref 0.450–4.500)

## 2024-07-09 LAB — VITAMIN B12: Vitamin B-12: >2000 pg/mL — ABNORMAL HIGH (ref 232–1245)

## 2024-07-09 LAB — HEMOGLOBIN A1C
Est. average glucose Bld gHb Est-mCnc: 97 mg/dL
Hgb A1c MFr Bld: 5 % (ref 4.8–5.6)

## 2024-07-09 LAB — VITAMIN D 25 HYDROXY (VIT D DEFICIENCY, FRACTURES): Vit D, 25-Hydroxy: 68.7 ng/mL (ref 30.0–100.0)

## 2024-07-10 ENCOUNTER — Ambulatory Visit: Payer: Self-pay

## 2024-07-10 NOTE — Progress Notes (Signed)
 The patient labs look good.   Pt aware of labs

## 2024-07-20 ENCOUNTER — Ambulatory Visit

## 2024-07-21 ENCOUNTER — Other Ambulatory Visit: Payer: Self-pay | Admitting: Internal Medicine

## 2024-07-22 ENCOUNTER — Other Ambulatory Visit: Payer: Self-pay

## 2024-07-22 MED ORDER — BISOPROLOL FUMARATE 5 MG PO TABS: 5.0000 mg | ORAL_TABLET | Freq: Every day | ORAL | 3 refills | Status: DC

## 2024-07-22 MED ORDER — CHLORTHALIDONE 25 MG PO TABS: 25.0000 mg | ORAL_TABLET | Freq: Every day | ORAL | 3 refills | Status: DC

## 2024-07-22 NOTE — Progress Notes (Signed)
 Rx refill

## 2024-08-05 DIAGNOSIS — L821 Other seborrheic keratosis: Secondary | ICD-10-CM | POA: Diagnosis not present

## 2024-08-05 DIAGNOSIS — L570 Actinic keratosis: Secondary | ICD-10-CM | POA: Diagnosis not present

## 2024-08-10 ENCOUNTER — Ambulatory Visit: Admitting: Internal Medicine

## 2024-08-10 DIAGNOSIS — E538 Deficiency of other specified B group vitamins: Secondary | ICD-10-CM | POA: Diagnosis not present

## 2024-08-11 DIAGNOSIS — E538 Deficiency of other specified B group vitamins: Secondary | ICD-10-CM | POA: Diagnosis not present

## 2024-08-11 NOTE — Progress Notes (Signed)
 Nurse Visit Vitamin B12 Injection

## 2024-08-13 ENCOUNTER — Other Ambulatory Visit: Payer: Self-pay | Admitting: Orthopedic Surgery

## 2024-09-11 ENCOUNTER — Ambulatory Visit

## 2024-09-15 ENCOUNTER — Other Ambulatory Visit: Payer: Self-pay

## 2024-09-15 MED ORDER — LISINOPRIL 40 MG PO TABS: 40.0000 mg | ORAL_TABLET | Freq: Every day | ORAL | 2 refills | Status: DC

## 2024-09-15 NOTE — Progress Notes (Signed)
 Rx refill

## 2024-09-28 ENCOUNTER — Other Ambulatory Visit: Payer: Self-pay | Admitting: Internal Medicine

## 2024-10-02 ENCOUNTER — Other Ambulatory Visit: Payer: Self-pay

## 2024-10-02 MED ORDER — GABAPENTIN 600 MG PO TABS: 600.0000 mg | ORAL_TABLET | Freq: Three times a day (TID) | ORAL | 3 refills | Status: DC

## 2024-10-02 NOTE — Progress Notes (Signed)
 Refilled Gabapentin  to mail order pharmacy.

## 2024-10-09 ENCOUNTER — Ambulatory Visit: Admitting: Internal Medicine

## 2024-10-09 ENCOUNTER — Encounter: Payer: Self-pay | Admitting: Internal Medicine

## 2024-10-09 VITALS — BP 150/58 | HR 52 | Temp 97.1°F | Resp 16 | Ht 69.0 in | Wt 176.2 lb

## 2024-10-09 DIAGNOSIS — G894 Chronic pain syndrome: Secondary | ICD-10-CM | POA: Diagnosis not present

## 2024-10-09 DIAGNOSIS — Z23 Encounter for immunization: Secondary | ICD-10-CM

## 2024-10-09 MED ORDER — HYDROCODONE-ACETAMINOPHEN 5-325 MG PO TABS: 1.0000 | ORAL_TABLET | Freq: Every evening | ORAL | 0 refills | Status: DC | PRN

## 2024-10-09 NOTE — Assessment & Plan Note (Signed)
 I reviewed his  CSR/PDMP.  I am going to give him 90 days (#90 tabs) of his pain meds at this time.  I only want him to use this as needed.

## 2024-10-09 NOTE — Addendum Note (Signed)
 Addended by: LENETTA LACKS on: 10/09/2024 02:23 PM   Modules accepted: Orders

## 2024-10-09 NOTE — Progress Notes (Signed)
 Office Visit  Subjective   Patient ID: Andrew Montgomery   DOB: 1932/06/03   Age: 89 y.o.   MRN: [id number]   Chief Complaint Chief Complaint  Patient presents with   Follow-up    Follow up     History of Present Illness Andrew Montgomery returns for followup of his chronic pain.   He states that he has most of his pain over his right hip however he does have lower back pain and history of neuropathy.  He uses hydrocodone /APAP 5/325mg  at bedtime prn.  He ran out of it a few months ago.  Dr. Georgina in [place] with ortho spine performed a MRI of lumbar spine on 01/17/2024 which showed chronic compression fracture of L3 with more than 80% loss of central body height. Large lipid poor hemangioma at L4. Degenerative changes are most prominent at L4-L5 with severe foraminal stenosis, compression of the exiting nerve root and interspinous bursitis.  Dr. Eldonna performed a lumbar ESI on 03/11/2024.  He had a repeat ESI done on 07/06/2024 of the lumbar spine.  The ESI has taken some of the pain away and states his pain is 3 out of 10 in the office.  He is using a walker at home for long distances and has a 1 point cane.  Again, Dr. Georgina had seen him several months ago where the patient's pain had increased.  They asked him to increase his dose of gabapentin  and wrote him for hydrocodone /APAP prn.  I had previously tried him on tramadol  but Dr. Georgina stopped it and started hydrocodone  as described.   He is now off hydrocodone  and is only taking gabapentin .  The patient has had problems with urinary and fecal retention in the past from hydrocodone /APAP but he states he is taking it once at night and he is currently not having any problems.  He states this controls his pain at night and he can rest.   He remains on gabapentin  600mg  BID and sometimes he takes it TID.  He is no longer on diclofenac  and I started him on celebrex  but he has not noticed much difference with it.  He has a history of radiculopathy of his lower  extremities.  He states that since his surgery that he now has pain over his left femur/thigh area.  This is a sharp pain and he states his arthritis is also severe in his left knee.  He states ortho saw him in followup and saw his femur was healing well.  The patient also has arthritis which involves all his joints including his back, legs, hips, and shoulders.    He has not had to use Tylenol  prn for a good while.   He also has cervical foraminal stenosis with a history of neck pain but his neck pain resolved 5 years ago.   He had a MRI of his cervical spine performed on 06/02/2018 and this demonstrated foraminal stenosis of C4-C5, C5-C6, and C6-C7 with edematous facet arthropathy of his left C4-C5.  There is no new weakness/numbness or loss of bowel/bladder function.  The patient does have a history of osteoporosis and a vertebral compression fracture.  He had a bone density performed on 12/2016 which showed a t-score of -2.1.  However, he has had a vertebral compression fracture which indicates he has osteoporosis.  He was on fosamax but this was stopped in 2023.  His Vit D level was normal on testing.   I did refer him to the neurosurgeon for  evaluation for kyphoplasty.  Their notes noted that he has a healed compression fracture but they felt he was having neuropathy in his hands and feet and sent him to neurology for evaluation.  I saw him several years ago where he was having lower extremity pain and numbness which initially began in his left leg which had progressively worsened and involved both bilateral lower extremities.  I did a lab work up and sent him for EMG testing and this showed no evidence of a peripheral neuropathy.  He did have a chronic left L4 radiculopathy seen on EMG testing.  I did start him on gabapentin  at that time.  We also obtained a lumbar xray and this demonstrated a partial compression of the body of L3.  He states he had a fall in 2011 and where he broke his ankle and hurt his  arm.  He states he is not sure if this caused any problems with his back but he had no back pain at that time.  I wanted to set him up with [facility] for evaluation for kyphoplasty but the patient refused to go because he states no one explained to him what was going on.  I reviewed our chart and the nurse informed him what was going on and he had stated in her note that he did not want surgery and declined to go.  However he finally did go see neurology with Dr. Tobie.  I do not have her notes but the neurosurgeon felt he has a peripheral neuropathy.  On lab testing for his lower extremity numbness, we discovered that he had a Vit B12 defiency.  He does get VIt B12 injections done monthly.  The patient was sent for physical therapy for his neuropathy for a few months and the patient quit going because he felt it was not helping.     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)    Osteoarthritis    Osteoporosis    PSA, INCREASED 01/05/2010   Radiculopathy with lower extremity symptoms    Seasonal allergies    Vertebral compression fracture (HCC)    Vitamin B12 deficiency    Vitamin D  deficiency      Allergies No Known Allergies   Medications  Current Outpatient Medications:    acetaminophen  (TYLENOL ) 500 MG tablet, Take 2 tablets (1,000 mg total) by mouth every 6 (six) hours as needed for headache, fever or mild pain., Disp: , Rfl: 0   amLODipine  (NORVASC ) 5 MG tablet, TAKE 1 TABLET BY MOUTH DAILY, Disp: 100 tablet, Rfl: 2   bisoprolol  (ZEBETA ) 5 MG tablet, Take 1 tablet (5 mg total) by mouth daily., Disp: 90 tablet, Rfl: 3   celecoxib  (CELEBREX ) 200 MG capsule, TAKE 1 CAPSULE  BY MOUTH TWICE  DAILY, Disp: 200 capsule, Rfl: 2   chlorthalidone  (HYGROTON ) 25 MG tablet, Take 1 tablet (25 mg total) by mouth daily., Disp: 90 tablet, Rfl: 3   Cholecalciferol (VITAMIN D ) 50 MCG (2000 UT) CAPS, Take 2,000 Units by mouth daily., Disp: , Rfl:    diclofenac  (VOLTAREN ) 75 MG EC tablet, TAKE 1 TABLET BY MOUTH TWICE  DAILY AS NEEDED, Disp: 200 tablet, Rfl: 2  gabapentin  (NEURONTIN ) 600 MG tablet, Take 1 tablet (600 mg total) by mouth 3 (three) times daily., Disp: 270 tablet, Rfl: 3   hydrALAZINE  (APRESOLINE ) 25 MG tablet, Take 1 tablet (25 mg total) by mouth 2 (two) times daily with a meal., Disp: 200 tablet, Rfl: 2   HYDROcodone -acetaminophen  (NORCO/VICODIN) 5-325 MG tablet, Take 1 tablet by mouth at bedtime as needed for moderate pain (pain score 4-6)., Disp: 30 tablet, Rfl: 0   lisinopril  (ZESTRIL ) 40 MG tablet, Take 1 tablet (40 mg total) by mouth daily., Disp: 100 tablet, Rfl: 2   Multiple Vitamin (MULTIVITAMIN WITH MINERALS) TABS tablet, Take 1 tablet by mouth daily., Disp: 130 tablet, Rfl: 0   omeprazole  (PRILOSEC) 20 MG capsule, TAKE 1 CAPSULE EVERY DAY, Disp: 90 capsule, Rfl: 3   polyethylene glycol powder (GLYCOLAX /MIRALAX ) 17 GM/SCOOP powder, Take 17 g by mouth daily as needed for mild constipation., Disp: 238 g, Rfl: 0   senna-docusate (SENOKOT-S) 8.6-50 MG tablet, Take 1 tablet by mouth at bedtime as needed for mild constipation., Disp: 90 tablet, Rfl: 1   fluticasone (FLONASE) 50 MCG/ACT nasal spray, Place into both nostrils. (Patient not taking: Reported on 10/09/2024), Disp: , Rfl:    Review of Systems Review of Systems  Constitutional:  Negative for chills and fever.  Eyes:  Negative for blurred vision and double vision.  Respiratory:  Negative for shortness of breath.   Cardiovascular:  Positive for leg swelling. Negative for chest pain.  Gastrointestinal:  Negative for abdominal pain, constipation, diarrhea, nausea and vomiting.  Musculoskeletal:  Positive for  back pain.  Neurological:  Negative for dizziness, weakness and headaches.       Objective:    Vitals BP (!) 150/58   Pulse (!) 52   Temp (!) 97.1 F (36.2 C) (Temporal)   Resp 16   Ht 5' 9 (1.753 m)   Wt 176 lb 3.2 oz (79.9 kg)   SpO2 99%   BMI 26.02 kg/m    Physical Examination Physical Exam Constitutional:      Appearance: Normal appearance. He is not ill-appearing.  Cardiovascular:     Rate and Rhythm: Normal rate and regular rhythm.     Pulses: Normal pulses.     Heart sounds: No murmur heard.    No friction rub. No gallop.  Pulmonary:     Effort: Pulmonary effort is normal. No respiratory distress.     Breath sounds: No wheezing, rhonchi or rales.  Abdominal:     General: Abdomen is flat. Bowel sounds are normal. There is no distension.     Palpations: Abdomen is soft.     Tenderness: There is no abdominal tenderness.  Musculoskeletal:     Right lower leg: No edema.     Left lower leg: No edema.  Skin:    General: Skin is warm and dry.     Findings: No rash.  Neurological:     Mental Status: He is alert.        Assessment & Plan:   Chronic pain syndrome I reviewed his [facility] CSR/PDMP.  I am going to give him 90 days (#90 tabs) of his pain meds at this time.  I only want him to use this as needed.    Return in about 3 months (around 01/09/2025).   Selinda Fleeta Finger, MD

## 2024-10-13 ENCOUNTER — Other Ambulatory Visit: Payer: Self-pay | Admitting: Internal Medicine

## 2024-10-13 ENCOUNTER — Ambulatory Visit

## 2024-10-13 MED ORDER — HYDROCODONE-ACETAMINOPHEN 5-325 MG PO TABS: 1.0000 | ORAL_TABLET | Freq: Every evening | ORAL | 0 refills | Status: AC | PRN

## 2024-11-16 ENCOUNTER — Ambulatory Visit

## 2024-11-17 ENCOUNTER — Other Ambulatory Visit: Payer: Self-pay | Admitting: Internal Medicine

## 2024-11-17 MED ORDER — HYDROCODONE-ACETAMINOPHEN 5-325 MG PO TABS: 1.0000 | ORAL_TABLET | Freq: Three times a day (TID) | ORAL | 0 refills | Status: DC | PRN

## 2024-12-22 ENCOUNTER — Ambulatory Visit

## 2025-01-04 ENCOUNTER — Ambulatory Visit: Admitting: Internal Medicine

## 2025-01-04 VITALS — BP 142/80 | HR 54 | Temp 97.0°F | Resp 18 | Ht 69.0 in | Wt 182.0 lb

## 2025-01-04 DIAGNOSIS — I10 Essential (primary) hypertension: Secondary | ICD-10-CM | POA: Diagnosis not present

## 2025-01-04 DIAGNOSIS — K5903 Drug induced constipation: Secondary | ICD-10-CM | POA: Insufficient documentation

## 2025-01-04 DIAGNOSIS — N3943 Post-void dribbling: Secondary | ICD-10-CM | POA: Diagnosis not present

## 2025-01-04 DIAGNOSIS — G894 Chronic pain syndrome: Secondary | ICD-10-CM

## 2025-01-04 DIAGNOSIS — G629 Polyneuropathy, unspecified: Secondary | ICD-10-CM | POA: Diagnosis not present

## 2025-01-04 DIAGNOSIS — M5416 Radiculopathy, lumbar region: Secondary | ICD-10-CM | POA: Diagnosis not present

## 2025-01-04 DIAGNOSIS — N401 Enlarged prostate with lower urinary tract symptoms: Secondary | ICD-10-CM

## 2025-01-04 MED ORDER — HYDROCODONE-ACETAMINOPHEN 5-325 MG PO TABS: 1.0000 | ORAL_TABLET | Freq: Three times a day (TID) | ORAL | 0 refills | Status: AC | PRN

## 2025-01-04 MED ORDER — TAMSULOSIN HCL 0.4 MG PO CAPS: 0.4000 mg | ORAL_CAPSULE | Freq: Every day | ORAL | 6 refills | Status: DC

## 2025-01-04 MED ORDER — SENNOSIDES-DOCUSATE SODIUM 8.6-50 MG PO TABS: 2.0000 | ORAL_TABLET | Freq: Every evening | ORAL | 6 refills | Status: AC | PRN

## 2025-01-04 NOTE — Progress Notes (Signed)
 "  Office Visit  Subjective   Patient ID: Andrew Montgomery   DOB: [birth year]   Age: 89 y.o.   MRN: [id number]   Chief Complaint Chief Complaint  Patient presents with   Follow-up    3 month follow up Needs a  walker with seat     History of Present Illness 89 years old male with a history of chronic low back pain and history of neuropathy who is here for follow-up.  He tells me that he hurt all the time in his back that radiate to right leg.  When he is sitting not doing anything his pain is 2 out of 10 in intensity but when he get up and walk pain get worse.  He had an MRI done on January 17, 2024 showed chronic compression fracture of L3 with 80% of central bodily height loss.  He also has a angioma at L4 and D degenerative changes L4-L5 with severe foraminal stenosis, compression of exiting nerve root and interspinous bursitis.  He takes gabapentin  600 mg 3 times a day and hydrocodone  1 tablet at nighttime.  He also takes Celebrex  200 mg twice a day but still he hurt all the time.  He was given back injection but that did not help so he stopped going to [place].  He tells me he if I can relieve his pain that he will improve his quality of life.  He also has degenerative changes of his cervical spine with foraminal stenosis.  He he takes hydrocodone  at nighttime but that only last for 3 to 4 hours.  He has L4 radiculopathy seen on EMG testing.  He admits that he get up at nighttime to urinate but he dribble a lot and it takes a long time to empty his bladder.  He do not take any medications for BPH.  He is complaining of constipation.  He was given senna +1 tablets but he still having constipation.  He has hypertension.  His blood pressure is controlled.  He also has peripheral neuropathy and he follows with neurologist Dr. Tobie.  He has a tingling feeling in his extremity in his hands.  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   Respiratory: Negative.    Cardiovascular: Negative.   Gastrointestinal:  Positive for constipation.  Musculoskeletal:  Positive for back pain.  Psychiatric/Behavioral:  The patient has insomnia.        Objective:    Vitals BP (!) 142/80   Pulse (!) 54   Temp (!) 97 F (36.1 C)   Resp 18   Ht 5' 9 (1.753 m)   Wt 182 lb (82.6 kg)   SpO2 98%   BMI 26.88 kg/m    Physical Examination Physical Exam Constitutional:      Appearance: Normal appearance.  Cardiovascular:  Rate and Rhythm: Normal rate and regular rhythm.     Heart sounds: Normal heart sounds.  Pulmonary:     Effort: Pulmonary effort is normal.     Breath sounds: Normal breath sounds.  Abdominal:     General: Bowel sounds are normal.     Palpations: Abdomen is soft.  Neurological:     General: No focal deficit present.     Mental Status: He is alert and oriented to person, place, and time.        Assessment & Plan:   Essential hypertension His blood pressure is controlled.  Drug-induced constipation He will take 2 senna plus daily for opioid-induced constipation.  Lumbar radiculopathy I have suggested to take hydrocodone  5-325 mg 1 tablets 3 times a day as a scheduled to control his pain.  He will continue with gabapentin  3 times a day and I will do BMP for evaluation of renal function.  Peripheral polyneuropathy He will continue with  gabapentin  3 times a day.    Benign prostatic hyperplasia with post-void dribbling I will start him on Flomax  0.4 mg daily.    Return in about 1 month (around 02/04/2025).   Roetta Dare, MD      [1] No Known Allergies  "

## 2025-01-04 NOTE — Assessment & Plan Note (Signed)
 He will continue with gabapentin  3 times a day.

## 2025-01-04 NOTE — Assessment & Plan Note (Signed)
 He will take 2 senna plus daily for opioid-induced constipation.

## 2025-01-04 NOTE — Assessment & Plan Note (Signed)
 I will start him on Flomax 0.4 mg daily.

## 2025-01-04 NOTE — Assessment & Plan Note (Signed)
 I have suggested to take hydrocodone  5-325 mg 1 tablets 3 times a day as a scheduled to control his pain.  He will continue with gabapentin  3 times a day and I will do BMP for evaluation of renal function.

## 2025-01-04 NOTE — Assessment & Plan Note (Signed)
 His blood pressure is controlled.

## 2025-01-05 LAB — CMP14 + ANION GAP
ALT: 9 IU/L (ref 0–44)
AST: 14 IU/L (ref 0–40)
Albumin: 4.3 g/dL (ref 3.6–4.6)
Alkaline Phosphatase: 58 IU/L (ref 48–129)
Anion Gap: 12 mmol/L (ref 10.0–18.0)
BUN/Creatinine Ratio: 27 — ABNORMAL HIGH (ref 10–24)
BUN: 30 mg/dL (ref 10–36)
Bilirubin Total: 0.3 mg/dL (ref 0.0–1.2)
CO2: 22 mmol/L (ref 20–29)
Calcium: 8.8 mg/dL (ref 8.6–10.2)
Chloride: 100 mmol/L (ref 96–106)
Creatinine, Ser: 1.1 mg/dL (ref 0.76–1.27)
Globulin, Total: 2 g/dL (ref 1.5–4.5)
Glucose: 95 mg/dL (ref 70–99)
Potassium: 4.6 mmol/L (ref 3.5–5.2)
Sodium: 134 mmol/L (ref 134–144)
Total Protein: 6.3 g/dL (ref 6.0–8.5)
eGFR: 64 mL/min/1.73

## 2025-01-06 ENCOUNTER — Other Ambulatory Visit: Payer: Self-pay

## 2025-01-06 ENCOUNTER — Ambulatory Visit: Payer: Self-pay

## 2025-01-06 NOTE — Progress Notes (Signed)
 Patient called.  Patient aware. His labs are good.

## 2025-01-08 ENCOUNTER — Ambulatory Visit: Admitting: Internal Medicine

## 2025-01-12 ENCOUNTER — Other Ambulatory Visit: Payer: Self-pay

## 2025-01-12 MED ORDER — HYDRALAZINE HCL 25 MG PO TABS: 25.0000 mg | ORAL_TABLET | Freq: Two times a day (BID) | ORAL | 2 refills | Status: DC

## 2025-01-12 MED ORDER — GABAPENTIN 600 MG PO TABS: 600.0000 mg | ORAL_TABLET | Freq: Three times a day (TID) | ORAL | 3 refills | Status: DC

## 2025-01-12 MED ORDER — TAMSULOSIN HCL 0.4 MG PO CAPS: 0.4000 mg | ORAL_CAPSULE | Freq: Every day | ORAL | 6 refills | Status: DC

## 2025-01-12 MED ORDER — BISOPROLOL FUMARATE 5 MG PO TABS: 5.0000 mg | ORAL_TABLET | Freq: Every day | ORAL | 3 refills | Status: DC

## 2025-01-12 MED ORDER — AMLODIPINE BESYLATE 5 MG PO TABS: 5.0000 mg | ORAL_TABLET | Freq: Every day | ORAL | 2 refills | Status: DC

## 2025-01-12 MED ORDER — LISINOPRIL 40 MG PO TABS: 40.0000 mg | ORAL_TABLET | Freq: Every day | ORAL | 2 refills | Status: DC

## 2025-01-13 ENCOUNTER — Other Ambulatory Visit: Payer: Self-pay

## 2025-01-13 MED ORDER — OMEPRAZOLE 20 MG PO CPDR: 20.0000 mg | DELAYED_RELEASE_CAPSULE | Freq: Every day | ORAL | 3 refills | Status: DC

## 2025-01-13 MED ORDER — CHLORTHALIDONE 25 MG PO TABS: 25.0000 mg | ORAL_TABLET | Freq: Every day | ORAL | 3 refills | Status: DC

## 2025-01-13 MED ORDER — CELECOXIB 200 MG PO CAPS: 200.0000 mg | ORAL_CAPSULE | Freq: Two times a day (BID) | ORAL | 2 refills | Status: DC

## 2025-01-14 ENCOUNTER — Other Ambulatory Visit: Payer: Self-pay

## 2025-01-14 MED ORDER — GABAPENTIN 600 MG PO TABS: 600.0000 mg | ORAL_TABLET | Freq: Three times a day (TID) | ORAL | 3 refills | Status: AC

## 2025-01-14 MED ORDER — CELECOXIB 200 MG PO CAPS: 200.0000 mg | ORAL_CAPSULE | Freq: Two times a day (BID) | ORAL | 2 refills | Status: AC

## 2025-01-14 MED ORDER — BISOPROLOL FUMARATE 5 MG PO TABS: 5.0000 mg | ORAL_TABLET | Freq: Every day | ORAL | 3 refills | Status: AC

## 2025-01-14 MED ORDER — AMLODIPINE BESYLATE 5 MG PO TABS: 5.0000 mg | ORAL_TABLET | Freq: Every day | ORAL | 2 refills | Status: AC

## 2025-01-14 MED ORDER — CHLORTHALIDONE 25 MG PO TABS: 25.0000 mg | ORAL_TABLET | Freq: Every day | ORAL | 3 refills | Status: AC

## 2025-01-14 MED ORDER — TAMSULOSIN HCL 0.4 MG PO CAPS: 0.4000 mg | ORAL_CAPSULE | Freq: Every day | ORAL | 6 refills | Status: AC

## 2025-01-14 MED ORDER — LISINOPRIL 40 MG PO TABS: 40.0000 mg | ORAL_TABLET | Freq: Every day | ORAL | 2 refills | Status: AC

## 2025-01-14 MED ORDER — HYDRALAZINE HCL 25 MG PO TABS: 25.0000 mg | ORAL_TABLET | Freq: Two times a day (BID) | ORAL | 2 refills | Status: AC

## 2025-01-14 MED ORDER — OMEPRAZOLE 20 MG PO CPDR: 20.0000 mg | DELAYED_RELEASE_CAPSULE | Freq: Every day | ORAL | 3 refills | Status: AC

## 2025-01-26 ENCOUNTER — Ambulatory Visit

## 2025-02-02 ENCOUNTER — Ambulatory Visit

## 2025-02-03 ENCOUNTER — Ambulatory Visit: Admitting: Internal Medicine
